# Patient Record
Sex: Female | Born: 1995 | Race: Black or African American | Hispanic: No | Marital: Single | Smoking: Former smoker
Health system: Southern US, Community
[De-identification: ages and names within clinical notes are randomized; demographics above are authoritative.]

## PROBLEM LIST (undated history)

## (undated) DIAGNOSIS — D649 Anemia, unspecified: Secondary | ICD-10-CM

## (undated) DIAGNOSIS — K219 Gastro-esophageal reflux disease without esophagitis: Secondary | ICD-10-CM

## (undated) DIAGNOSIS — F419 Anxiety disorder, unspecified: Secondary | ICD-10-CM

## (undated) DIAGNOSIS — I10 Essential (primary) hypertension: Secondary | ICD-10-CM

## (undated) DIAGNOSIS — Z8719 Personal history of other diseases of the digestive system: Secondary | ICD-10-CM

## (undated) HISTORY — PX: WISDOM TOOTH EXTRACTION: SHX21

## (undated) HISTORY — DX: Essential (primary) hypertension: I10

---
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
## 2017-01-02 ENCOUNTER — Ambulatory Visit: Payer: BLUE CROSS/BLUE SHIELD | Admitting: Physical Therapy

## 2017-01-02 DIAGNOSIS — R2689 Other abnormalities of gait and mobility: Secondary | ICD-10-CM

## 2017-01-02 DIAGNOSIS — M25562 Pain in left knee: Secondary | ICD-10-CM | POA: Diagnosis not present

## 2017-01-02 DIAGNOSIS — M6281 Muscle weakness (generalized): Secondary | ICD-10-CM

## 2017-01-02 DIAGNOSIS — G8929 Other chronic pain: Secondary | ICD-10-CM

## 2017-01-02 DIAGNOSIS — M25561 Pain in right knee: Secondary | ICD-10-CM

## 2017-01-02 NOTE — Therapy (Signed)
[facility] [address] [place], [place], [address] Phone: [phone]   Fax:  [phone]  Physical Therapy Treatment  Patient Details  Name: Abbye Lao MRN: [id number] Date of Birth: 02/09/1996 Referring Provider: Rodolph Bong, MD  Encounter Date: 01/02/2017      PT End of Session - 01/02/17 1211    Visit Number 3   Number of Visits 17   Date for PT Re-Evaluation 02/07/17   Authorization Type Medicaid: 16 visits through 02/11/17   Authorization - Visit Number 2   Authorization - Number of Visits 16   PT Start Time 1156  arrived late   PT Stop Time 1234   PT Time Calculation (min) 38 min   Activity Tolerance Patient tolerated treatment well   Behavior During Therapy [facility] for tasks assessed/performed      No past medical history on file.  No past surgical history on file.  There were no vitals filed for this visit.      Subjective Assessment - 01/02/17 1200    Subjective Some mornings I can't put any weight on my knee and that has been happen more consistently.  I can start putting weight on it in the afternoons.  I am doing the exercises but doesn't seem to help.   Limitations Walking;Standing   How long can you stand comfortably? weight shifing to the Rt at work.  10/10 pain after 3-4 hours at work.    How long can you walk comfortably? 10 minutes   Diagnostic tests x-ray: pt reports that the Lt knee is "growing out of alignment"   Patient Stated Goals reduce pain, stand for work, walk longer   Currently in Pain? Yes   Pain Score 6    Pain Location Knee   Pain Orientation Left;Right   Pain Descriptors / Indicators Aching;Tightness;Shooting;Throbbing   Pain Type Chronic pain   Pain Onset More than a month ago   Pain Frequency Constant   Aggravating Factors  standing and walking   Pain Relieving Factors no pressure, anti-inflammatory helps swelling   Multiple Pain Sites No                          [facility] Adult PT Treatment/Exercise - 01/02/17 0001      Therapeutic Activites    Therapeutic Activities ADL's   ADL's walking and standing with focus on slight knee bend     Knee/Hip Exercises: Stretches   Active Hamstring Stretch Both;5 reps;10 seconds   Soleus Stretch Both;5 reps;10 seconds     Knee/Hip Exercises: Aerobic   Nustep Level 2 x 6 minutes     Knee/Hip Exercises: Standing   Knee Flexion Strengthening;Both;10 reps     Knee/Hip Exercises: Seated   Long Arc Quad Strengthening;Both;2 sets;10 reps  2 lb     Knee/Hip Exercises: Supine   Short Arc Quad Sets Strengthening;Both;10 reps   Short Arc Quad Sets Limitations 2 lb with 5 sec holds   Straight Leg Raises Strengthening;Both;10 reps   Straight Leg Raises Limitations 5 sec holds                PT Education - 01/02/17 1235    Education provided Yes   Education Details soleus stretch   Person(s) Educated Patient   Methods Demonstration;Handout;Explanation;Verbal cues   Comprehension Verbalized understanding;Returned demonstration          PT Short Term Goals - 01/02/17 1218  PT SHORT TERM GOAL #1   Title be independent in initial HEP   Baseline reports she has been performing at home   Time 4   Period Weeks   Status On-going     PT SHORT TERM GOAL #2   Title report 25% reduction in Lt knee pain at the end of a work shift   Time 4   Period Weeks   Status On-going     PT SHORT TERM GOAL #3   Title improve strength and reduce pain to walk for 15 minutes without limitation   Time 4   Period Weeks   Status On-going     PT SHORT TERM GOAL #4   Title demonstrate 4-/5 Lt quad strength without lag to improve stability and endurance   Time 4   Period Weeks   Status On-going           PT Long Term Goals - 12/13/16 1858      PT LONG TERM GOAL #1   Title be independent in advanced HEP   Baseline no HEP in place   Time 8   Period Weeks   Status  New     PT LONG TERM GOAL #2   Title reduce FOTO to < or = to 42% limitation   Baseline 57%   Time 8   Period Weeks   Status New     PT LONG TERM GOAL #3   Title report < or = to 5/10 Lt and Rt knee pain at the end of a work shift     Baseline 7-10/10   Time 8   Period Weeks   Status New     PT LONG TERM GOAL #4   Title demonstrate Lt quad strength to > or = to 4/5 to improve stability     Baseline 3+/5 with lag   Time 8   Period Weeks   Status New     PT LONG TERM GOAL #5   Title reduce pain to walk for > or = to 30 minutes to improve community independence     Baseline 10 minutes with 7-10/10 pain   Time 8   Period Weeks   Status New               Plan - 01/02/17 1214    Clinical Impression Statement Patient able to add resistance to exercises today.  She is challenged with exercises demonstrating fatigue and has difficulty with straight leg raise only able to lift 2 inches from mat.  Needed education and cues to not lock out knees when standing and walking.  given soleus stretch for increased ankle DF ROM.  Continues to need skilled PT for improved LE strength and functional standing and walking activities.   PT Treatment/Interventions ADLs/Self Care Home Management;Cryotherapy;Electrical Stimulation;Iontophoresis 4mg /ml Dexamethasone;Functional mobility training;Stair training;Gait training;Ultrasound;Moist Heat;Therapeutic activities;Therapeutic exercise;Balance training;Neuromuscular re-education;Patient/family education;Passive range of motion;Manual techniques;Taping;Vasopneumatic Device   PT Next Visit Plan progress LE strength and flexibility as tolerated   Consulted and Agree with Plan of Care Patient      Patient will benefit from skilled therapeutic intervention in order to improve the following deficits and impairments:  Abnormal gait, Difficulty walking, Pain, Decreased endurance, Decreased activity tolerance, Impaired flexibility, Decreased  strength  Visit Diagnosis: Chronic pain of left knee  Chronic pain of right knee  Muscle weakness (generalized)  Other abnormalities of gait and mobility     Problem List There are no active problems to display for this patient.  Vincente Poli, PT 01/02/2017, 12:52 PM  [facility] [address] [place], [place], [address] Phone: [phone]   Fax:  [phone]  Name: Adeley Hodgin MRN: [id number] Date of Birth: February 13, 1996

## 2017-01-02 NOTE — Patient Instructions (Signed)
Achilles / Soleus, Standing    Stand, right foot behind, heel on floor and turned slightly out. Lower hips and bend knees. Hold _10__ seconds. Repeat __5_ times per session. Do __2_ sessions per day on both sides  Copyright  VHI. All rights reserved.

## 2017-01-04 ENCOUNTER — Encounter: Payer: Medicaid Other | Admitting: Physical Therapy

## 2017-01-10 ENCOUNTER — Ambulatory Visit: Payer: BLUE CROSS/BLUE SHIELD | Attending: Sports Medicine | Admitting: Physical Therapy

## 2017-01-10 DIAGNOSIS — R2689 Other abnormalities of gait and mobility: Secondary | ICD-10-CM | POA: Diagnosis present

## 2017-01-10 DIAGNOSIS — G8929 Other chronic pain: Secondary | ICD-10-CM | POA: Diagnosis present

## 2017-01-10 DIAGNOSIS — M25562 Pain in left knee: Secondary | ICD-10-CM | POA: Diagnosis not present

## 2017-01-10 DIAGNOSIS — M6281 Muscle weakness (generalized): Secondary | ICD-10-CM | POA: Diagnosis present

## 2017-01-10 DIAGNOSIS — M25561 Pain in right knee: Secondary | ICD-10-CM | POA: Insufficient documentation

## 2017-01-10 NOTE — Therapy (Signed)
[facility] [address], [address] [place], [place], [address] Phone: [phone]   Fax:  [phone]  Physical Therapy Treatment  Patient Details  Name: Brooke Horn MRN: [id number] Date of Birth: 1995/11/14 Referring Provider: Rodolph Bong, MD  Encounter Date: 01/10/2017      PT End of Session - 01/10/17 1201    Visit Number 4   Number of Visits 17   Date for PT Re-Evaluation 02/07/17   Authorization Type Medicaid: 16 visits through 02/11/17   Authorization - Visit Number 3   Authorization - Number of Visits 16   PT Start Time 1153   PT Stop Time 1232   PT Time Calculation (min) 39 min   Activity Tolerance Patient tolerated treatment well;No increased pain   Behavior During Therapy [facility] for tasks assessed/performed      No past medical history on file.  No past surgical history on file.  There were no vitals filed for this visit.      Subjective Assessment - 01/10/17 1157    Subjective Pt reports that she woke up with a bad headache today. She says she is still completing her exercises at home, but these don't seem to be making much of a difference.    Limitations Walking;Standing   How long can you stand comfortably? weight shifing to the Rt at work.  10/10 pain after 3-4 hours at work.    How long can you walk comfortably? 10 minutes   Diagnostic tests x-ray: pt reports that the Lt knee is "growing out of alignment"   Patient Stated Goals reduce pain, stand for work, walk longer   Currently in Pain? No/denies  none sitting here right now    Pain Onset More than a month ago                         [facility] Adult PT Treatment/Exercise - 01/10/17 0001      Knee/Hip Exercises: Stretches   Lobbyist Both;3 reps;30 seconds   Quad Stretch Limitations prone      Knee/Hip Exercises: Supine   Other Supine Knee/Hip Exercises supine BUE pressdown with hip flexion press on physioball x15 reps each       Knee/Hip Exercises: Sidelying   Hip ABduction Both;2 sets;5 reps   Hip ABduction Limitations Hip ER hold during flexion/extension      Manual Therapy   Manual Therapy Soft tissue mobilization;Myofascial release   Soft tissue mobilization STM Lt quadriceps    Myofascial Release TrP release Lt recuts femoris                 PT Education - 01/10/17 1203    Education provided Yes   Education Details technique with therex; educated pt on benefits of dry needling    Person(s) Educated Patient   Methods Explanation;Handout   Comprehension Verbalized understanding          PT Short Term Goals - 01/02/17 1218      PT SHORT TERM GOAL #1   Title be independent in initial HEP   Baseline reports she has been performing at home   Time 4   Period Weeks   Status On-going     PT SHORT TERM GOAL #2   Title report 25% reduction in Lt knee pain at the end of a work shift   Time 4   Period Weeks   Status On-going     PT SHORT TERM GOAL #3  Title improve strength and reduce pain to walk for 15 minutes without limitation   Time 4   Period Weeks   Status On-going     PT SHORT TERM GOAL #4   Title demonstrate 4-/5 Lt quad strength without lag to improve stability and endurance   Time 4   Period Weeks   Status On-going           PT Long Term Goals - 12/13/16 1858      PT LONG TERM GOAL #1   Title be independent in advanced HEP   Baseline no HEP in place   Time 8   Period Weeks   Status New     PT LONG TERM GOAL #2   Title reduce FOTO to < or = to 42% limitation   Baseline 57%   Time 8   Period Weeks   Status New     PT LONG TERM GOAL #3   Title report < or = to 5/10 Lt and Rt knee pain at the end of a work shift     Baseline 7-10/10   Time 8   Period Weeks   Status New     PT LONG TERM GOAL #4   Title demonstrate Lt quad strength to > or = to 4/5 to improve stability     Baseline 3+/5 with lag   Time 8   Period Weeks   Status New     PT LONG TERM  GOAL #5   Title reduce pain to walk for > or = to 30 minutes to improve community independence     Baseline 10 minutes with 7-10/10 pain   Time 8   Period Weeks   Status New               Plan - 01/10/17 1229    Clinical Impression Statement Pt continues to report minimal improvements in her knee pain with HEP adherence. Session focused on exercise to increase posterior chain activation as well as completing manual techniques to address muscle knotting and trigger points specifically throughout the Lt quadriceps. Pt able to complete all exercises without report of increase in pain, however quadriceps stretches at the end of the session did reportedly aggravate her Lt knee. Therapist encouraged pt to continue with her HEP as instructed and will updated as needed.    PT Treatment/Interventions ADLs/Self Care Home Management;Cryotherapy;Electrical Stimulation;Iontophoresis 4mg /ml Dexamethasone;Functional mobility training;Stair training;Gait training;Ultrasound;Moist Heat;Therapeutic activities;Therapeutic exercise;Balance training;Neuromuscular re-education;Patient/family education;Passive range of motion;Manual techniques;Taping;Vasopneumatic Device   PT Next Visit Plan progress LE strength and flexibility as tolerated; possible dry needling to quadriceps    Consulted and Agree with Plan of Care Patient      Patient will benefit from skilled therapeutic intervention in order to improve the following deficits and impairments:  Abnormal gait, Difficulty walking, Pain, Decreased endurance, Decreased activity tolerance, Impaired flexibility, Decreased strength  Visit Diagnosis: Chronic pain of left knee  Chronic pain of right knee  Muscle weakness (generalized)  Other abnormalities of gait and mobility     Problem List There are no active problems to display for this patient.   12:36 PM,01/10/17 Marylyn IshiharaSara Kiser PT, DPT [facility]   [phone]  [facility] [address] [place], [place], [address] Phone: [phone]   Fax:  [phone]  Name: Brooke Horn MRN: [id number] Date of Birth: 07/20/1995

## 2017-01-10 NOTE — Patient Instructions (Signed)

## 2017-01-11 ENCOUNTER — Ambulatory Visit: Payer: BLUE CROSS/BLUE SHIELD | Admitting: Physical Therapy

## 2017-01-11 DIAGNOSIS — M6281 Muscle weakness (generalized): Secondary | ICD-10-CM

## 2017-01-11 DIAGNOSIS — G8929 Other chronic pain: Secondary | ICD-10-CM

## 2017-01-11 DIAGNOSIS — M25561 Pain in right knee: Secondary | ICD-10-CM

## 2017-01-11 DIAGNOSIS — R2689 Other abnormalities of gait and mobility: Secondary | ICD-10-CM

## 2017-01-11 DIAGNOSIS — M25562 Pain in left knee: Principal | ICD-10-CM

## 2017-01-11 NOTE — Therapy (Addendum)
[facility] [address], [place], [place], [address] Phone: [phone]   Fax:  [phone]  Physical Therapy Treatment/Discharge  Patient Details  Name: Brooke Horn MRN: [id number] Date of Birth: [birth year] Referring Provider: Vickki Hearing, MD  Encounter Date: 01/11/2017      PT End of Session - 01/11/17 1243    Visit Number 5   Number of Visits 17   Date for PT Re-Evaluation 02/07/17   Authorization Type Medicaid: 16 visits through 02/11/17   Authorization - Visit Number 4   Authorization - Number of Visits 16   PT Start Time 1324   PT Stop Time 4010   PT Time Calculation (min) 38 min   Activity Tolerance Patient tolerated treatment well;No increased pain   Behavior During Therapy [facility] for tasks assessed/performed      No past medical history on file.  No past surgical history on file.  There were no vitals filed for this visit.      Subjective Assessment - 01/11/17 1242    Subjective Pt reports that she was sore following her last session. She has no complaints at this time. She is interested in doing the dry needling, but not this session.    Limitations Walking;Standing   How long can you stand comfortably? weight shifing to the Rt at work.  10/10 pain after 3-4 hours at work.    How long can you walk comfortably? 10 minutes   Diagnostic tests x-ray: pt reports that the Lt knee is "growing out of alignment"   Patient Stated Goals reduce pain, stand for work, walk longer   Currently in Pain? No/denies   Pain Onset More than a month ago                         [facility] Adult PT Treatment/Exercise - 01/11/17 0001      Exercises   Exercises Other Exercises   Other Exercises  quadruped firehydrants 2x10 reps each LE; Prone foam rolling quadriceps 2x2 min bouts      Knee/Hip Exercises: Machines for Strengthening   Cybex Leg Press seat 8, BLE with green TB around knees first set with 50#  x20 reps, 2nd set increased to 90# x10 reps      Knee/Hip Exercises: Supine   Straight Leg Raises Both;10 reps;1 set   Straight Leg Raises Limitations slight hip ER and BUE pressdown      Knee/Hip Exercises: Sidelying   Clams 2x10 reps each with green TB and abdominal activation      Ankle Exercises: Stretches   Gastroc Stretch 2 reps;30 seconds  slantboard with ball between knees                 PT Education - 01/11/17 1319    Education provided Yes   Education Details technique with therex; addition to HEP    Person(s) Educated Patient   Methods Handout;Verbal cues;Tactile cues;Explanation   Comprehension Verbalized understanding;Returned demonstration          PT Short Term Goals - 01/02/17 1218      PT SHORT TERM GOAL #1   Title be independent in initial HEP   Baseline reports she has been performing at home   Time 4   Period Weeks   Status On-going     PT SHORT TERM GOAL #2   Title report 25% reduction in Lt knee pain at the end of a work shift   Time  4   Period Weeks   Status On-going     PT SHORT TERM GOAL #3   Title improve strength and reduce pain to walk for 15 minutes without limitation   Time 4   Period Weeks   Status On-going     PT SHORT TERM GOAL #4   Title demonstrate 4-/5 Lt quad strength without lag to improve stability and endurance   Time 4   Period Weeks   Status On-going           PT Long Term Goals - 12/13/16 1858      PT LONG TERM GOAL #1   Title be independent in advanced HEP   Baseline no HEP in place   Time 8   Period Weeks   Status New     PT LONG TERM GOAL #2   Title reduce FOTO to < or = to 42% limitation   Baseline 57%   Time 8   Period Weeks   Status New     PT LONG TERM GOAL #3   Title report < or = to 5/10 Lt and Rt knee pain at the end of a work shift     Baseline 7-10/10   Time 8   Period Weeks   Status New     PT LONG TERM GOAL #4   Title demonstrate Lt quad strength to > or = to 4/5 to  improve stability     Baseline 3+/5 with lag   Time 8   Period Weeks   Status New     PT LONG TERM GOAL #5   Title reduce pain to walk for > or = to 30 minutes to improve community independence     Baseline 10 minutes with 7-10/10 pain   Time 8   Period Weeks   Status New               Plan - 01/11/17 1319    Clinical Impression Statement Pt reporting no pain upon arrival, however she does seem to continue having issues with being on her feet for long periods of time. Continued this session with therex to promote proximal hip and trunk strength. Pt did demonstrate muscle shaking and fatigue with new exercises completed, and she required moderate verbal/tactile cues to improve technique. Ended with foam rolling and stretching, pt reporting no increase in pain by the end of today's session.   PT Treatment/Interventions ADLs/Self Care Home Management;Cryotherapy;Electrical Stimulation;Iontophoresis 91m/ml Dexamethasone;Functional mobility training;Stair training;Gait training;Ultrasound;Moist Heat;Therapeutic activities;Therapeutic exercise;Balance training;Neuromuscular re-education;Patient/family education;Passive range of motion;Manual techniques;Taping;Vasopneumatic Device   PT Next Visit Plan progress proximal LE strength and flexibility as tolerated; possible dry needling to quadriceps    PT Home Exercise Plan added sidelying clamshells    Consulted and Agree with Plan of Care Patient      Patient will benefit from skilled therapeutic intervention in order to improve the following deficits and impairments:  Abnormal gait, Difficulty walking, Pain, Decreased endurance, Decreased activity tolerance, Impaired flexibility, Decreased strength  Visit Diagnosis: Chronic pain of left knee  Chronic pain of right knee  Other abnormalities of gait and mobility  Muscle weakness (generalized)     Problem List There are no active problems to display for this patient.   1:24  PM,01/11/17 [place] DPT [facility] [address] [place] [address]Phone: [phone]  Fax:  [phone] Name: Brooke Glassing  Horn MRN: [id number] Date of Birth: March 31, 1996     *Addendum to discharge pt from PT and resolve episode of care  PHYSICAL THERAPY DISCHARGE SUMMARY  Visits from Start of Care: 5  Current functional level related to goals / functional outcomes: See above for more details     Remaining deficits: See above for more details    Education / Equipment: See above for more details  Plan: Patient agrees to discharge.  Patient goals were not met. Patient is being discharged due to the patient's request.  ?????     Pt called on 01/30/17 requesting to be discharged from PT due to going out of town for a while and being unable to come to her appointments.  12:15 PM,01/30/17 [place], [place] [facility] [place]

## 2017-01-12 ENCOUNTER — Encounter: Payer: Medicaid Other | Admitting: Physical Therapy

## 2017-01-23 ENCOUNTER — Telehealth: Payer: Self-pay

## 2017-01-23 ENCOUNTER — Ambulatory Visit: Payer: BLUE CROSS/BLUE SHIELD

## 2017-01-23 NOTE — Telephone Encounter (Signed)
PT called pt due to missed appointment today.  She forgot about appointment.  She also canceled appointment for 01/25/17 due to conflict with her MD appointment.

## 2017-01-30 ENCOUNTER — Encounter: Payer: Medicaid Other | Admitting: Physical Therapy

## 2017-02-01 ENCOUNTER — Encounter: Payer: Medicaid Other | Admitting: Physical Therapy

## 2018-05-08 DIAGNOSIS — J45909 Unspecified asthma, uncomplicated: Secondary | ICD-10-CM

## 2018-05-08 HISTORY — DX: Unspecified asthma, uncomplicated: J45.909

## 2018-05-08 NOTE — L&D Delivery Note (Signed)
Delivery Note   Patient Name: Brooke Horn DOB: [birth year] MRN: [id number]  Date of admission: 04/26/2019 Delivering MD: Noralyn Pick  Date of delivery: 04/26/19 Type of delivery: SVD  Newborn Data: Live born female  Birth Weight:   APGAR: ,   Newborn Delivery   Birth date/time: 04/26/2019 12:14:00 Delivery type:      Brooke Horn, 23 y.o., @ [redacted]w[redacted]d,  G1P0, who was admitted for IOL for postdates. I was called to the room when she progressed +2 station in the second stage of labor with BBOW, verbal consent for AROM, clear tolerated well.  She pushed for 15/min.  She delivered a viable infant, cephalic and restituted to the ROT position over an intact perineum.  A nuchal cord   was not identified. The baby was placed on maternal abdomen while initial step of NRP were perfmored (Dry, Stimulated, and warmed). Hat placed on baby for thermoregulation. Delayed cord clamping was performed for 2 minutes.  Cord double clamped and cut.  Cord cut by grandmother. Apgar scores were 9 and 9. Prophylactic Pitocin was started in the third stage of labor for active management. The placenta delivered spontaneously, shultz, with a 2 vessel cord and was sent to LD.  Inspection revealed 1st degree and labial. An examination of the vaginal vault and cervix was free from lacerations. The uterus was firm, bleeding stable.  The repair was done under lidocaine.   Placenta and umbilical artery blood gas were not sent.  There were no complications during the procedure.  Mom and baby skin to skin following delivery. Left in stable condition.  Maternal Info: Anesthesia: All natural Episiotomy: no Lacerations:  1st degree with left perilabial Suture Repair: 3.0 vicryl CT-1 and 4.0 vicryl SH Est. Blood Loss (mL):  493mls  Newborn Info:  Baby Sex: female Babies Name: Brooke Horn APGAR (1 MIN):   APGAR (5 MINS):   APGAR (10 MINS):     Mom to postpartum.  Baby to Couplet care / Skin to Skin.  DR Nelda Marseille updated on birth     [place], [place], NP-C 04/26/19 1:01 PM

## 2018-08-19 ENCOUNTER — Other Ambulatory Visit: Payer: Self-pay

## 2018-08-19 ENCOUNTER — Encounter (HOSPITAL_COMMUNITY): Payer: Self-pay | Admitting: Emergency Medicine

## 2018-08-19 ENCOUNTER — Emergency Department (HOSPITAL_COMMUNITY)
Admission: EM | Admit: 2018-08-19 | Discharge: 2018-08-19 | Disposition: A | Discharge status: Home / Self Care | Payer: BLUE CROSS/BLUE SHIELD | Attending: Emergency Medicine | Admitting: Emergency Medicine

## 2018-08-19 DIAGNOSIS — Z3A01 Less than 8 weeks gestation of pregnancy: Secondary | ICD-10-CM

## 2018-08-19 DIAGNOSIS — Z3201 Encounter for pregnancy test, result positive: Secondary | ICD-10-CM | POA: Diagnosis present

## 2018-08-19 DIAGNOSIS — F17210 Nicotine dependence, cigarettes, uncomplicated: Secondary | ICD-10-CM | POA: Diagnosis not present

## 2018-08-19 LAB — POC URINE PREG, ED: Preg Test, Ur: POSITIVE — AB

## 2018-08-19 MED ORDER — PRENATAL COMPLETE 14-0.4 MG PO TABS: 1.0000 | ORAL_TABLET | Freq: Every day | ORAL | 0 refills | Status: DC

## 2018-08-19 NOTE — Discharge Instructions (Addendum)
Please read attached information. If you experience any new or worsening signs or symptoms please return to the emergency room for evaluation. Please follow-up with your primary care provider or specialist as discussed. Please use medication prescribed only as directed and discontinue taking if you have any concerning signs or symptoms.   °

## 2018-08-19 NOTE — ED Provider Notes (Signed)
[facility] EMERGENCY DEPARTMENT Provider Note   CSN: [id number] Arrival date & time: 08/19/18  0848    History   Chief Complaint Chief Complaint  Patient presents with  . Possible Pregnancy    HPI Brooke Horn is a 23 y.o. female.     HPI   G89, P63 23 year old female presents today with complaints of pregnancy.  Patient notes her last menstrual cycle was on March 7.  She notes intermittent minimal crampy sensation in her pelvis denies any severe pain, denies any vaginal leading or discharge.  Patient notes breast tenderness.  Notes she had 4+ pregnancy test at home and is here to verify pregnancy status.  She does not have an OB/GYN presently.   History reviewed. No pertinent past medical history.  There are no active problems to display for this patient.   History reviewed. No pertinent surgical history.   OB History   No obstetric history on file.      Home Medications    Prior to Admission medications   Medication Sig Start Date End Date Taking? Authorizing Provider  naproxen (NAPROSYN) 250 MG tablet Take by mouth 2 (two) times daily with a meal.    [provider]  Prenatal Vit-Fe Fumarate-FA (PRENATAL COMPLETE) 14-0.4 MG TABS Take 1 tablet by mouth daily. 08/19/18   Eyvonne Mechanic, PA-C    Family History No family history on file.  Social History Social History   Tobacco Use  . Smoking status: Current Every Day Smoker  . Smokeless tobacco: Current User  Substance Use Topics  . Alcohol use: Not Currently  . Drug use: Not Currently     Allergies   Patient has no known allergies.   Review of Systems Review of Systems  All other systems reviewed and are negative.    Physical Exam Updated Vital Signs BP 136/68 (BP Location: Right Arm)   Pulse 81   Temp 99.4 F (37.4 C) (Oral)   Resp 16   LMP 07/16/2018   SpO2 100%   Physical Exam Vitals signs and nursing note reviewed.  Constitutional:      Appearance: She  is well-developed.  HENT:     Head: Normocephalic and atraumatic.  Eyes:     General: No scleral icterus.       Right eye: No discharge.        Left eye: No discharge.     Conjunctiva/sclera: Conjunctivae normal.     Pupils: Pupils are equal, round, and reactive to light.  Neck:     Musculoskeletal: Normal range of motion.     Vascular: No JVD.     Trachea: No tracheal deviation.  Pulmonary:     Effort: Pulmonary effort is normal.     Breath sounds: No stridor.  Abdominal:     Comments: Abdomen soft nttp   Neurological:     Mental Status: She is alert and oriented to person, place, and time.     Coordination: Coordination normal.  Psychiatric:        Behavior: Behavior normal.        Thought Content: Thought content normal.        Judgment: Judgment normal.      ED Treatments / Results  Labs (all labs ordered are listed, but only abnormal results are displayed) Labs Reviewed  POC URINE PREG, ED - Abnormal; Notable for the following components:      Result Value   Preg Test, Ur POSITIVE (*)    All other  components within normal limits    EKG None  Radiology No results found.  Procedures Procedures (including critical care time)  Medications Ordered in ED Medications - No data to display   Initial Impression / Assessment and Plan / ED Course  I have reviewed the triage vital signs and the nursing notes.  Pertinent labs & imaging results that were available during my care of the patient were reviewed by me and considered in my medical decision making (see chart for details).        Labs:   Imaging:  Consults:  Therapeutics:  Discharge Meds:   Assessment/Plan: 23 year old female presents today with trimester pregnancy.  She has no complicating features here.  She is stable for outpatient follow-up.  Return precautions given.  She verbalized understanding and agreement to this plan.      Final Clinical Impressions(s) / ED Diagnoses   Final  diagnoses:  Less than [redacted] weeks gestation of pregnancy    ED Discharge Orders         Ordered    Prenatal Vit-Fe Fumarate-FA (PRENATAL COMPLETE) 14-0.4 MG TABS  Daily     08/19/18 0938           Eyvonne MechanicHedges, Viann Nielson, PA-C 08/19/18 1131    Loren RacerYelverton, David, MD 08/20/18 [phone]

## 2018-08-19 NOTE — ED Triage Notes (Signed)
Pt. Stated,Ive took 4 pregnancy test and positive and just want to make sure I am.

## 2018-08-20 ENCOUNTER — Telehealth: Payer: Self-pay | Admitting: Obstetrics & Gynecology

## 2018-08-20 NOTE — Telephone Encounter (Signed)
The patient called to schedule an appointment as she recently found she is pregnant. She currently has insurance with her dad however she is unsure if it will continue to be active and does not want to use it. Stated she will get her own insurance through [place]. Informed of our new address.

## 2018-09-19 ENCOUNTER — Encounter (HOSPITAL_COMMUNITY): Payer: Self-pay

## 2018-09-19 LAB — OB RESULTS CONSOLE RPR: RPR: NONREACTIVE

## 2018-09-19 LAB — OB RESULTS CONSOLE RUBELLA ANTIBODY, IGM: Rubella: IMMUNE

## 2018-09-19 LAB — OB RESULTS CONSOLE ANTIBODY SCREEN: Antibody Screen: NEGATIVE

## 2018-09-19 LAB — OB RESULTS CONSOLE GBS: GBS: NEGATIVE

## 2018-09-19 LAB — OB RESULTS CONSOLE ABO/RH: RH Type: POSITIVE

## 2018-09-19 LAB — OB RESULTS CONSOLE HIV ANTIBODY (ROUTINE TESTING): HIV: NONREACTIVE

## 2018-09-19 LAB — OB RESULTS CONSOLE HEPATITIS B SURFACE ANTIGEN: Hepatitis B Surface Ag: NEGATIVE

## 2018-09-25 LAB — OB RESULTS CONSOLE GC/CHLAMYDIA: Chlamydia: POSITIVE

## 2018-10-18 ENCOUNTER — Other Ambulatory Visit: Payer: Self-pay

## 2018-10-28 ENCOUNTER — Encounter (HOSPITAL_COMMUNITY): Payer: Self-pay | Admitting: *Deleted

## 2018-10-30 ENCOUNTER — Other Ambulatory Visit: Payer: Self-pay

## 2018-10-30 ENCOUNTER — Ambulatory Visit (HOSPITAL_COMMUNITY): Payer: Self-pay | Admitting: Obstetrics and Gynecology

## 2018-10-30 ENCOUNTER — Ambulatory Visit (HOSPITAL_COMMUNITY): Payer: BC Managed Care – PPO | Attending: Obstetrics and Gynecology

## 2018-12-06 ENCOUNTER — Other Ambulatory Visit (HOSPITAL_COMMUNITY): Payer: Self-pay | Admitting: Obstetrics and Gynecology

## 2018-12-09 ENCOUNTER — Ambulatory Visit (HOSPITAL_COMMUNITY): Payer: Medicaid Other | Admitting: *Deleted

## 2018-12-09 ENCOUNTER — Ambulatory Visit (HOSPITAL_COMMUNITY)
Admission: RE | Admit: 2018-12-09 | Discharge: 2018-12-09 | Disposition: A | Discharge status: Home / Self Care | Payer: Medicaid Other | Source: Ambulatory Visit | Attending: Obstetrics and Gynecology | Admitting: Obstetrics and Gynecology

## 2018-12-09 ENCOUNTER — Encounter (HOSPITAL_COMMUNITY): Payer: Self-pay | Admitting: *Deleted

## 2018-12-09 ENCOUNTER — Other Ambulatory Visit: Payer: Self-pay

## 2018-12-09 ENCOUNTER — Other Ambulatory Visit (HOSPITAL_COMMUNITY): Payer: Self-pay | Admitting: *Deleted

## 2018-12-09 VITALS — BP 113/62 | HR 65 | Temp 98.6°F

## 2018-12-09 DIAGNOSIS — O359XX0 Maternal care for (suspected) fetal abnormality and damage, unspecified, not applicable or unspecified: Secondary | ICD-10-CM | POA: Diagnosis not present

## 2018-12-09 DIAGNOSIS — O26842 Uterine size-date discrepancy, second trimester: Secondary | ICD-10-CM | POA: Diagnosis not present

## 2018-12-09 DIAGNOSIS — Z3A21 21 weeks gestation of pregnancy: Secondary | ICD-10-CM

## 2018-12-09 DIAGNOSIS — Z363 Encounter for antenatal screening for malformations: Secondary | ICD-10-CM

## 2018-12-09 DIAGNOSIS — Z3689 Encounter for other specified antenatal screening: Secondary | ICD-10-CM | POA: Diagnosis present

## 2018-12-09 DIAGNOSIS — O99212 Obesity complicating pregnancy, second trimester: Secondary | ICD-10-CM | POA: Diagnosis not present

## 2018-12-09 DIAGNOSIS — Z362 Encounter for other antenatal screening follow-up: Secondary | ICD-10-CM

## 2019-01-07 ENCOUNTER — Ambulatory Visit (HOSPITAL_COMMUNITY)
Admission: RE | Admit: 2019-01-07 | Discharge: 2019-01-07 | Disposition: A | Discharge status: Home / Self Care | Payer: Medicaid Other | Source: Ambulatory Visit | Attending: Obstetrics and Gynecology | Admitting: Obstetrics and Gynecology

## 2019-01-07 ENCOUNTER — Encounter (HOSPITAL_COMMUNITY): Payer: Self-pay | Admitting: *Deleted

## 2019-01-07 ENCOUNTER — Other Ambulatory Visit: Payer: Self-pay

## 2019-01-07 ENCOUNTER — Ambulatory Visit (HOSPITAL_COMMUNITY): Payer: Medicaid Other | Admitting: *Deleted

## 2019-01-07 VITALS — BP 117/65 | HR 83 | Temp 98.7°F

## 2019-01-07 DIAGNOSIS — O359XX0 Maternal care for (suspected) fetal abnormality and damage, unspecified, not applicable or unspecified: Secondary | ICD-10-CM

## 2019-01-07 DIAGNOSIS — O99212 Obesity complicating pregnancy, second trimester: Secondary | ICD-10-CM | POA: Diagnosis not present

## 2019-01-07 DIAGNOSIS — Z362 Encounter for other antenatal screening follow-up: Secondary | ICD-10-CM

## 2019-01-07 DIAGNOSIS — I10 Essential (primary) hypertension: Secondary | ICD-10-CM | POA: Insufficient documentation

## 2019-01-07 DIAGNOSIS — O26842 Uterine size-date discrepancy, second trimester: Secondary | ICD-10-CM | POA: Diagnosis not present

## 2019-01-07 DIAGNOSIS — Z3A25 25 weeks gestation of pregnancy: Secondary | ICD-10-CM

## 2019-01-14 ENCOUNTER — Encounter (HOSPITAL_COMMUNITY): Payer: Self-pay

## 2019-01-14 ENCOUNTER — Other Ambulatory Visit (HOSPITAL_COMMUNITY): Payer: Self-pay | Admitting: Obstetrics and Gynecology

## 2019-02-05 LAB — OB RESULTS CONSOLE GC/CHLAMYDIA: Chlamydia: POSITIVE

## 2019-04-21 ENCOUNTER — Encounter (HOSPITAL_COMMUNITY): Payer: Self-pay | Admitting: *Deleted

## 2019-04-21 ENCOUNTER — Telehealth (HOSPITAL_COMMUNITY): Payer: Self-pay | Admitting: *Deleted

## 2019-04-21 NOTE — Telephone Encounter (Signed)
Preadmission screen  

## 2019-04-23 ENCOUNTER — Other Ambulatory Visit: Payer: Self-pay | Admitting: Obstetrics & Gynecology

## 2019-04-24 ENCOUNTER — Other Ambulatory Visit (HOSPITAL_COMMUNITY)
Admission: RE | Admit: 2019-04-24 | Discharge: 2019-04-24 | Disposition: A | Discharge status: Home / Self Care | Payer: Medicaid Other | Source: Ambulatory Visit | Attending: Obstetrics & Gynecology | Admitting: Obstetrics & Gynecology

## 2019-04-24 DIAGNOSIS — Z20828 Contact with and (suspected) exposure to other viral communicable diseases: Secondary | ICD-10-CM | POA: Diagnosis not present

## 2019-04-24 DIAGNOSIS — Z01812 Encounter for preprocedural laboratory examination: Secondary | ICD-10-CM | POA: Insufficient documentation

## 2019-04-24 LAB — SARS CORONAVIRUS 2 (TAT 6-24 HRS): SARS Coronavirus 2: NEGATIVE

## 2019-04-25 NOTE — H&P (Signed)
  History of Present Illness: Pt is a 23 year old G1P0 at 41.0 weeks who presents for a scheduled IOL for post dates. Denies contractions, vb, or lof. Reports adequate fetal movement.     Review of Systems: Negative x 14 systems reviewed except as noted in the HPI.    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  Heart: regular rate & rhythm.  No murmurs/rubs/gallops Lungs: clear to auscultation bilaterally Abdomen: soft, gravid, non-tender Pelvic:External: Normal external female genitalia  Cervix: 2/50/-2 IBOW  Bedside US confirms vertex presentation  Extremities: non-tender, symmetric, mild edema bilaterally.  DTRs: WNL  Neurologic: Alert & oriented x 3.   FHR tracing: Cat I Contractions: Absent         Assessment: Carlia Horn is a 23 y.o. G1P0 female at [redacted]w[redacted]d here for IOL due to postdates   Plan: 1. Admit to Labor & Delivery - notify attending & anesthesia.   2. Routine admission labs 3. GBS pos, begin prophylaxis 4. Begin cytotec for cervical ripening 5.  Maternal well being: Stable 6.  Fetal well being: stable 7.  Labor management: plan IV pain medication  Beatrix Fetters, CNM

## 2019-04-26 ENCOUNTER — Inpatient Hospital Stay (HOSPITAL_COMMUNITY)
Admission: AD | Admit: 2019-04-26 | Discharge: 2019-04-28 | DRG: 807 | Disposition: A | Discharge status: Home / Self Care | Payer: Medicaid Other | Attending: Obstetrics & Gynecology | Admitting: Obstetrics & Gynecology

## 2019-04-26 ENCOUNTER — Inpatient Hospital Stay (HOSPITAL_COMMUNITY): Payer: Medicaid Other

## 2019-04-26 ENCOUNTER — Other Ambulatory Visit: Payer: Self-pay

## 2019-04-26 ENCOUNTER — Inpatient Hospital Stay (HOSPITAL_COMMUNITY)
Admission: AD | Admit: 2019-04-26 | Payer: BC Managed Care – PPO | Source: Home / Self Care | Admitting: Obstetrics & Gynecology

## 2019-04-26 ENCOUNTER — Encounter (HOSPITAL_COMMUNITY): Payer: Self-pay | Admitting: Obstetrics & Gynecology

## 2019-04-26 DIAGNOSIS — Z3A41 41 weeks gestation of pregnancy: Secondary | ICD-10-CM | POA: Diagnosis not present

## 2019-04-26 DIAGNOSIS — O99824 Streptococcus B carrier state complicating childbirth: Secondary | ICD-10-CM | POA: Diagnosis present

## 2019-04-26 DIAGNOSIS — O9081 Anemia of the puerperium: Secondary | ICD-10-CM | POA: Diagnosis not present

## 2019-04-26 DIAGNOSIS — O480 Post-term pregnancy: Principal | ICD-10-CM | POA: Diagnosis present

## 2019-04-26 LAB — CBC
HCT: 35.5 % — ABNORMAL LOW (ref 36.0–46.0)
Hemoglobin: 11.3 g/dL — ABNORMAL LOW (ref 12.0–15.0)
MCH: 23.6 pg — ABNORMAL LOW (ref 26.0–34.0)
MCHC: 31.8 g/dL (ref 30.0–36.0)
MCV: 74.1 fL — ABNORMAL LOW (ref 80.0–100.0)
Platelets: 239 10*3/uL (ref 150–400)
RBC: 4.79 MIL/uL (ref 3.87–5.11)
RDW: 14.8 % (ref 11.5–15.5)
WBC: 7 10*3/uL (ref 4.0–10.5)
nRBC: 0 % (ref 0.0–0.2)

## 2019-04-26 LAB — TYPE AND SCREEN
ABO/RH(D): O POS
Antibody Screen: NEGATIVE

## 2019-04-26 LAB — RPR: RPR Ser Ql: NONREACTIVE

## 2019-04-26 LAB — ABO/RH: ABO/RH(D): O POS

## 2019-04-26 MED ORDER — OXYTOCIN 40 UNITS IN NORMAL SALINE INFUSION - SIMPLE MED
1.0000 m[IU]/min | INTRAVENOUS | Status: DC
  Administered 2019-04-26: 2 m[IU]/min via INTRAVENOUS
  Filled 2019-04-26: qty 1000

## 2019-04-26 MED ORDER — OXYTOCIN 40 UNITS IN NORMAL SALINE INFUSION - SIMPLE MED: 1.0000 m[IU]/min | INTRAVENOUS | Status: DC

## 2019-04-26 MED ORDER — TERBUTALINE SULFATE 1 MG/ML IJ SOLN: 0.2500 mg | Freq: Once | INTRAMUSCULAR | Status: DC | PRN

## 2019-04-26 MED ORDER — WITCH HAZEL-GLYCERIN EX PADS: 1.0000 "application " | MEDICATED_PAD | CUTANEOUS | Status: DC | PRN

## 2019-04-26 MED ORDER — MISOPROSTOL 25 MCG QUARTER TABLET
25.0000 ug | ORAL_TABLET | ORAL | Status: DC | PRN
  Administered 2019-04-26: 25 ug via VAGINAL
  Filled 2019-04-26: qty 1

## 2019-04-26 MED ORDER — ONDANSETRON HCL 4 MG PO TABS: 4.0000 mg | ORAL_TABLET | ORAL | Status: DC | PRN

## 2019-04-26 MED ORDER — ACETAMINOPHEN 325 MG PO TABS: 650.0000 mg | ORAL_TABLET | ORAL | Status: DC | PRN

## 2019-04-26 MED ORDER — SODIUM CHLORIDE 0.9 % IV SOLN
5.0000 10*6.[IU] | Freq: Once | INTRAVENOUS | Status: AC
  Administered 2019-04-26: 5 10*6.[IU] via INTRAVENOUS
  Filled 2019-04-26: qty 5

## 2019-04-26 MED ORDER — FLEET ENEMA 7-19 GM/118ML RE ENEM: 1.0000 | ENEMA | RECTAL | Status: DC | PRN

## 2019-04-26 MED ORDER — DIBUCAINE (PERIANAL) 1 % EX OINT: 1.0000 "application " | TOPICAL_OINTMENT | CUTANEOUS | Status: DC | PRN

## 2019-04-26 MED ORDER — LACTATED RINGERS IV SOLN: 500.0000 mL | INTRAVENOUS | Status: DC | PRN

## 2019-04-26 MED ORDER — ZOLPIDEM TARTRATE 5 MG PO TABS: 5.0000 mg | ORAL_TABLET | Freq: Every evening | ORAL | Status: DC | PRN

## 2019-04-26 MED ORDER — SOD CITRATE-CITRIC ACID 500-334 MG/5ML PO SOLN
30.0000 mL | ORAL | Status: DC | PRN
  Administered 2019-04-26: 30 mL via ORAL
  Filled 2019-04-26 (×2): qty 30

## 2019-04-26 MED ORDER — LIDOCAINE HCL (PF) 1 % IJ SOLN
30.0000 mL | INTRAMUSCULAR | Status: DC | PRN
  Filled 2019-04-26: qty 30

## 2019-04-26 MED ORDER — ONDANSETRON HCL 4 MG/2ML IJ SOLN: 4.0000 mg | INTRAMUSCULAR | Status: DC | PRN

## 2019-04-26 MED ORDER — OXYTOCIN BOLUS FROM INFUSION
500.0000 mL | Freq: Once | INTRAVENOUS | Status: AC
  Administered 2019-04-26: 500 mL via INTRAVENOUS

## 2019-04-26 MED ORDER — OXYTOCIN 40 UNITS IN NORMAL SALINE INFUSION - SIMPLE MED
2.5000 [IU]/h | INTRAVENOUS | Status: DC
  Administered 2019-04-26: 2.5 [IU]/h via INTRAVENOUS

## 2019-04-26 MED ORDER — BENZOCAINE-MENTHOL 20-0.5 % EX AERO
1.0000 "application " | INHALATION_SPRAY | CUTANEOUS | Status: DC | PRN
  Administered 2019-04-26: 1 via TOPICAL
  Filled 2019-04-26: qty 56

## 2019-04-26 MED ORDER — IBUPROFEN 600 MG PO TABS
600.0000 mg | ORAL_TABLET | Freq: Four times a day (QID) | ORAL | Status: DC
  Administered 2019-04-26 – 2019-04-28 (×8): 600 mg via ORAL
  Filled 2019-04-26 (×8): qty 1

## 2019-04-26 MED ORDER — DIPHENHYDRAMINE HCL 25 MG PO CAPS: 25.0000 mg | ORAL_CAPSULE | Freq: Four times a day (QID) | ORAL | Status: DC | PRN

## 2019-04-26 MED ORDER — PRENATAL MULTIVITAMIN CH
1.0000 | ORAL_TABLET | Freq: Every day | ORAL | Status: DC
  Administered 2019-04-27: 1 via ORAL
  Filled 2019-04-26 (×2): qty 1

## 2019-04-26 MED ORDER — SIMETHICONE 80 MG PO CHEW
80.0000 mg | CHEWABLE_TABLET | ORAL | Status: DC | PRN
  Administered 2019-04-27: 80 mg via ORAL
  Filled 2019-04-26: qty 1

## 2019-04-26 MED ORDER — COCONUT OIL OIL: 1.0000 "application " | TOPICAL_OIL | Status: DC | PRN

## 2019-04-26 MED ORDER — FENTANYL CITRATE (PF) 100 MCG/2ML IJ SOLN
50.0000 ug | INTRAMUSCULAR | Status: DC | PRN
  Administered 2019-04-26 (×2): 50 ug via INTRAVENOUS
  Filled 2019-04-26: qty 2

## 2019-04-26 MED ORDER — ONDANSETRON HCL 4 MG/2ML IJ SOLN: 4.0000 mg | Freq: Four times a day (QID) | INTRAMUSCULAR | Status: DC | PRN

## 2019-04-26 MED ORDER — PENICILLIN G POT IN DEXTROSE 60000 UNIT/ML IV SOLN
3.0000 10*6.[IU] | INTRAVENOUS | Status: DC
  Administered 2019-04-26 (×2): 3 10*6.[IU] via INTRAVENOUS
  Filled 2019-04-26 (×3): qty 50

## 2019-04-26 MED ORDER — TETANUS-DIPHTH-ACELL PERTUSSIS 5-2.5-18.5 LF-MCG/0.5 IM SUSP: 0.5000 mL | Freq: Once | INTRAMUSCULAR | Status: DC

## 2019-04-26 MED ORDER — SENNOSIDES-DOCUSATE SODIUM 8.6-50 MG PO TABS
2.0000 | ORAL_TABLET | ORAL | Status: DC
  Administered 2019-04-27 – 2019-04-28 (×2): 2 via ORAL
  Filled 2019-04-26 (×2): qty 2

## 2019-04-26 MED ORDER — CALCIUM CARBONATE ANTACID 500 MG PO CHEW
400.0000 mg | CHEWABLE_TABLET | Freq: Once | ORAL | Status: AC
  Administered 2019-04-26: 400 mg via ORAL
  Filled 2019-04-26: qty 2

## 2019-04-26 MED ORDER — LACTATED RINGERS IV SOLN: INTRAVENOUS | Status: DC

## 2019-04-26 MED ORDER — ACETAMINOPHEN 325 MG PO TABS
650.0000 mg | ORAL_TABLET | ORAL | Status: DC | PRN
  Administered 2019-04-26: 650 mg via ORAL
  Filled 2019-04-26: qty 2

## 2019-04-26 NOTE — Progress Notes (Signed)
SVD baby girl skin to skin

## 2019-04-26 NOTE — Progress Notes (Signed)
Pt up to BR

## 2019-04-26 NOTE — Progress Notes (Signed)
Pt resting, listening to music with her mother at bedside

## 2019-04-26 NOTE — Progress Notes (Addendum)
  Brooke Horn is a 23 y.o. G1P0 at [redacted]w[redacted]d  IOL post dates Cytotec given x 1 dose, mild cramping GBS pos, receiving 2nd dose of abx now  Subjective: Pt notes mild cramping with cytotec  Objective: BP 128/71   Pulse (!) 59   Temp 98.9 F (37.2 C) (Axillary)   Resp 17   Ht 5\' 9"  (1.753 m)   Wt 123.6 kg   LMP 07/13/2018   BMI 40.23 kg/m   FHT: Cat I tracing UC:  Irregular q 2-3 minutes SVE:   3/80/-2 IBOW    Assessment / Plan: IOL due to post dates  PT may have light breakfast now Begin Pitocin per protocol Declines epidural at this time. Desires IV pain medication PRN AROM when presenting part well applied Anticipate SVD  Beatrix Fetters, CNM

## 2019-04-26 NOTE — Progress Notes (Signed)
First push, CNM at BS, movement noted 

## 2019-04-27 DIAGNOSIS — O9081 Anemia of the puerperium: Secondary | ICD-10-CM | POA: Diagnosis not present

## 2019-04-27 LAB — CBC
HCT: 31.1 % — ABNORMAL LOW (ref 36.0–46.0)
Hemoglobin: 9.6 g/dL — ABNORMAL LOW (ref 12.0–15.0)
MCH: 23.2 pg — ABNORMAL LOW (ref 26.0–34.0)
MCHC: 30.9 g/dL (ref 30.0–36.0)
MCV: 75.3 fL — ABNORMAL LOW (ref 80.0–100.0)
Platelets: 219 10*3/uL (ref 150–400)
RBC: 4.13 MIL/uL (ref 3.87–5.11)
RDW: 14.9 % (ref 11.5–15.5)
WBC: 11.9 10*3/uL — ABNORMAL HIGH (ref 4.0–10.5)
nRBC: 0 % (ref 0.0–0.2)

## 2019-04-27 MED ORDER — POLYSACCHARIDE IRON COMPLEX 150 MG PO CAPS
150.0000 mg | ORAL_CAPSULE | Freq: Every day | ORAL | Status: DC
  Administered 2019-04-27 – 2019-04-28 (×2): 150 mg via ORAL
  Filled 2019-04-27 (×2): qty 1

## 2019-04-27 NOTE — Discharge Summary (Signed)
SVD OB Discharge Summary     Patient Name: Brooke Horn DOB: 1995/07/25 MRN: [id number]  Date of admission: 04/26/2019 Delivering MD: Noralyn Pick  Date of delivery: 04/26/2019 Type of delivery: SVD  Newborn Data: Sex: Baby female Live born female  Birth Weight: 7 lb 3.7 oz (3280 g) APGAR: 9, 9  Newborn Delivery   Birth date/time: 04/26/2019 12:14:00 Delivery type: Vaginal, Spontaneous      Feeding: breast Infant being discharge to home with mother in stable condition.   Admitting diagnosis: Post term pregnancy, 41 weeks [O48.0, Z3A.41] Intrauterine pregnancy: [redacted]w[redacted]d     Secondary diagnosis:  Active Problems:   Post term pregnancy, 41 weeks   Postpartum anemia   SVD (spontaneous vaginal delivery)                                Complications: None                                                              Intrapartum Procedures: spontaneous vaginal delivery Postpartum Procedures: none Complications-Operative and Postpartum: 1st degree perineal laceration Augmentation: AROM, Pitocin and Cytotec   History of Present Illness: Ms. Brooke Horn is a 23 y.o. female, G1P1001, who presents at [redacted]w[redacted]d weeks gestation. The patient has been followed at  [facility] Gynecology  Her pregnancy has been complicated by:  Patient Active Problem List   Diagnosis Date Noted  . SVD (spontaneous vaginal delivery) 04/28/2019  . Postpartum anemia 04/27/2019  . Post term pregnancy, 41 weeks 04/26/2019    Hospital course:  Induction of Labor With Vaginal Delivery   23 y.o. yo G1P1001 at [redacted]w[redacted]d was admitted to the hospital 04/26/2019 for induction of labor.  Indication for induction: Postdates.  Patient had an uncomplicated labor course as follows: Membrane Rupture Time/Date: 12:04 PM ,04/26/2019   Intrapartum Procedures: Episiotomy: None [1]                                         Lacerations:  Labial [10];1st degree [2]  Patient had delivery of a Viable infant.   Information for the patient's newborn:  Brooke, Horn [[id number]]  Delivery Method: Vag-Spont    04/26/2019  Details of delivery can be found in separate delivery note.  Patient had a routine postpartum course. Patient is discharged home 04/28/19. Postpartum Day # 2 : S/P NSVD due to IOL for postdates. Patient up ad lib, denies syncope or dizziness. Reports consuming regular diet without issues and denies N/V. Patient reports 0 bowel movement + passing flatus.  Denies issues with urination and reports bleeding is "lighter."  Patient is breastfeeding and reports going well.  Desires mini pills for postpartum contraception.  Pain is being appropriately managed with use of po meds. Hgb drop to 9.6 on iron, will continue at home, stable, asymptomatic.    Physical exam  Vitals:   04/26/19 1924 04/27/19 0532 04/27/19 1420 04/27/19 2232  BP: 123/87 128/75 116/68 116/68  Pulse: 89 77 70 70  Resp: 18 18 18 18   Temp: 98.8 F (37.1 C) 98.6 F (37 C) 98.2 F (36.8 C) 98.7  F (37.1 C)  TempSrc: Oral Oral Oral Oral  SpO2: 100% 100% 100%   Weight:      Height:       General: alert, cooperative and no distress Lochia: appropriate Uterine Fundus: firm Perineum: approximate, no hematomas noted DVT Evaluation: No evidence of DVT seen on physical exam. Negative Homan's sign. No cords or calf tenderness. No significant calf/ankle edema.  Labs: Lab Results  Component Value Date   WBC 11.9 (H) 04/27/2019   HGB 9.6 (L) 04/27/2019   HCT 31.1 (L) 04/27/2019   MCV 75.3 (L) 04/27/2019   PLT 219 04/27/2019   CMP Latest Ref Rng & Units 08/26/2013  Glucose 70 - 99 mg/dL 981(X)  BUN 6 - 23 mg/dL 12  Creatinine 9.14 - 7.82 mg/dL 9.56  Sodium 213 - 086 mEq/L 140  Potassium 3.7 - 5.3 mEq/L 4.5  Chloride 96 - 112 mEq/L 105  CO2 19 - 32 mEq/L 22  Calcium 8.4 - 10.5 mg/dL 9.6  Total Protein 6.0 - 8.3 g/dL 7.8  Total Bilirubin 0.3 - 1.2 mg/dL <5.7(Q)  Alkaline Phos 47 - 119 U/L 93  AST 0 - 37  U/L 23  ALT 0 - 35 U/L 13    Date of discharge: 04/28/2019 Discharge Diagnoses: Post-date pregnancy Discharge instruction: per After Visit Summary and "Baby and Me Booklet".  After visit meds:   Activity:           unrestricted and pelvic rest Advance as tolerated. Pelvic rest for 6 weeks.  Diet:                routine Medications: PNV, Ibuprofen, Colace and Iron Postpartum contraception: Progesterone only pills Condition:  Pt discharge to home with baby in stable Anemia: Continue iron at home.   Meds: Allergies as of 04/28/2019   No Known Allergies     Medication List    STOP taking these medications   calcium carbonate 500 MG chewable tablet Commonly known as: TUMS - dosed in mg elemental calcium   IRON PO     TAKE these medications   acetaminophen 500 MG tablet Commonly known as: TYLENOL Take 500 mg by mouth every 6 (six) hours as needed for mild pain or headache.   ibuprofen 600 MG tablet Commonly known as: ADVIL Take 1 tablet (600 mg total) by mouth every 6 (six) hours.   iron polysaccharides 150 MG capsule Commonly known as: NIFEREX Take 1 capsule (150 mg total) by mouth daily.   Prenatal Complete 14-0.4 MG Tabs Take 1 tablet by mouth daily.       Discharge Follow Up:  Follow-up Information    [facility]. Schedule an appointment as soon as possible for a visit in 6 week(s).   Specialty: Obstetrics and Gynecology Contact information: [address] [place] [address] [phone]           [place], NP-C, CNM 04/28/2019, 5:08 AM  Dale Belf[place], FNP

## 2019-04-27 NOTE — Progress Notes (Signed)
Subjective: Postpartum Day # 1 : S/P NSVD due to IOL for postdates. Patient up ad lib, denies syncope or dizziness. Reports consuming regular diet without issues and denies N/V. Patient reports 0 bowel movement + passing flatus.  Denies issues with urination and reports bleeding is "lighter."  Patient is breastfeeding and reports going well.  Desires mini pill for postpartum contraception.  Pain is being appropriately managed with use of po med.   2nd laceration Feeding:  breast Contraceptive plan:  Mini pill Baby female  Objective: Vital signs in last 24 hours: Patient Vitals for the past 24 hrs:  BP Temp Temp src Pulse Resp SpO2  04/26/19 1924 123/87 98.8 F (37.1 C) Oral 89 18 100 %  04/26/19 1604 122/81 99.2 F (37.3 C) Oral 88 18 100 %  04/26/19 1452 128/78 99.3 F (37.4 C) Oral 66 18 --  04/26/19 1315 (!) 116/92 -- -- (!) 177 -- --  04/26/19 1300 102/86 -- -- (!) 202 -- --  04/26/19 1245 120/63 -- -- (!) 140 -- --  04/26/19 1231 125/78 -- -- 71 -- --  04/26/19 1225 (!) 131/58 -- -- 73 -- --  04/26/19 1215 -- 98.8 F (37.1 C) Oral -- -- --  04/26/19 1214 (!) 131/106 -- -- 82 -- --  04/26/19 1131 (!) 132/96 -- -- 95 -- --  04/26/19 1100 127/86 -- -- 69 -- --  04/26/19 1032 (!) 104/58 -- -- 94 -- --  04/26/19 1001 119/82 -- -- 82 -- --  04/26/19 0931 (!) 91/41 -- -- 81 -- --  04/26/19 0900 119/76 -- -- 91 -- --  04/26/19 0831 103/78 -- -- 100 -- --  04/26/19 0810 101/67 -- -- 98 -- --  04/26/19 0730 109/75 98 F (36.7 C) Oral 80 -- --  04/26/19 0720 112/61 -- -- 64 -- --  04/26/19 0614 128/71 -- -- (!) 59 17 --     Physical Exam:  General: alert, cooperative, appears stated age and no distress Mood/Affect: Happy Lungs: clear to auscultation, no wheezes, rales or rhonchi, symmetric air entry.  Heart: normal rate, regular rhythm, normal S1, S2, no murmurs, rubs, clicks or gallops. Breast: breasts appear normal, no suspicious masses, no skin or nipple changes or axillary  nodes. Abdomen:  + bowel sounds, soft, non-tender GU: perineum approximate, healing well. No signs of external hematomas.  Uterine Fundus: firm Lochia: appropriate Skin: Warm, Dry. DVT Evaluation: No evidence of DVT seen on physical exam. Negative Homan's sign. No cords or calf tenderness. No significant calf/ankle edema.  CBC Latest Ref Rng & Units 04/26/2019 08/26/2013  WBC 4.0 - 10.5 K/uL 7.0 6.4  Hemoglobin 12.0 - 15.0 g/dL 11.3(L) 10.1(L)  Hematocrit 36.0 - 46.0 % 35.5(L) 32.7(L)  Platelets 150 - 400 K/uL 239 304    No results found for this or any previous visit (from the past 24 hour(s)).   CBG (last 3)  No results for input(s): GLUCAP in the last 72 hours.   I/O last 3 completed shifts: In: -  Out: 350 [Blood:350]   Assessment Postpartum Day # 1 : S/P NSVD due to IOL for posdates. Pt stable. -1 involution. breastfeeding. Hemodynamically stable.   Plan: Continue other mgmt as ordered VTE prophylactics: Early ambulated as tolerates.  Pain control: Motrin/Tylenol PRN Education given regarding options for contraception, including barrier methods, injectable contraception, IUD placement, oral contraceptives.  Plan for discharge tomorrow, Breastfeeding and Lactation consult   Dr. Nelda Marseille to be updated on patient status  Luvenia Starch  Jabe Jeanbaptiste NP-C, CNM 04/27/2019, 5:12 AM

## 2019-04-27 NOTE — Lactation Note (Signed)
This note was copied from a baby's chart. Lactation Consultation Note Baby 28 hrs old at time of consult. Mom BF in cross cradle position. Baby had great body alignment and positioning. Mom denies painful latched. Mom stated comfortable. Adjusted pillows for support.  Mom has everted nipples.  Newborn behavior, STS, I&O, breast massage, supply and demand discussed. Mom has no questions or concerns at this time. Encouraged to call for questions or assistance. Lactation brochure given.  Patient Name: Brooke Horn [id number] Date: 04/27/2019 Reason for consult: Initial assessment;Primapara;Term   Maternal Data Has patient been taught Hand Expression?: Yes Does the patient have breastfeeding experience prior to this delivery?: No  Feeding Feeding Type: Breast Fed  LATCH Score Latch: Grasps breast easily, tongue down, lips flanged, rhythmical sucking.  Audible Swallowing: A few with stimulation  Type of Nipple: Everted at rest and after stimulation  Comfort (Breast/Nipple): Soft / non-tender  Hold (Positioning): No assistance needed to correctly position infant at breast.  LATCH Score: 9  Interventions Interventions: Breast feeding basics reviewed;Support pillows;Breast massage;Breast compression;Hand express  Lactation Tools Discussed/Used     Consult Status Consult Status: Follow-up Date: 04/28/19 Follow-up type: In-patient    Theodoro Kalata 04/27/2019, 4:28 AM

## 2019-04-28 MED ORDER — POLYSACCHARIDE IRON COMPLEX 150 MG PO CAPS: 150.0000 mg | ORAL_CAPSULE | Freq: Every day | ORAL | 2 refills | Status: DC

## 2019-04-28 MED ORDER — IBUPROFEN 600 MG PO TABS: 600.0000 mg | ORAL_TABLET | Freq: Four times a day (QID) | ORAL | 0 refills | Status: DC

## 2019-04-28 NOTE — Lactation Note (Signed)
This note was copied from a baby's chart. Lactation Consultation Note Baby 82 hrs old. Mom states BF well. Baby is cluster feeding. Mom denies issues BF. States she thinks the baby is doing well and having no trouble. Has no questions. Engorgement, management, milk storage, breast care, I&O, supply and demand discussed. Mom has [place], reminded of outside resources. Mom can demonstrated hand expression. Baby latched w/o difficulty in cross-cradle position.  Patient Name: Brooke Horn [id number] Date: 04/28/2019 Reason for consult: Follow-up assessment;Primapara   Maternal Data    Feeding Feeding Type: Breast Fed  LATCH Score Latch: Grasps breast easily, tongue down, lips flanged, rhythmical sucking.  Audible Swallowing: Spontaneous and intermittent  Type of Nipple: Everted at rest and after stimulation  Comfort (Breast/Nipple): Soft / non-tender  Hold (Positioning): No assistance needed to correctly position infant at breast.  LATCH Score: 10  Interventions Interventions: Breast feeding basics reviewed  Lactation Tools Discussed/Used     Consult Status Consult Status: Complete Date: 04/28/19    Theodoro Kalata 04/28/2019, 3:39 AM

## 2021-02-27 IMAGING — US US MFM OB DETAIL +14 WK
1 series · 12 of 28 positions shown · non-contrast
Comparison: none

[Series 1: us mfm ob detail +14 wk · 74 acquisitions, 12 frames shown]
[im 3/74]
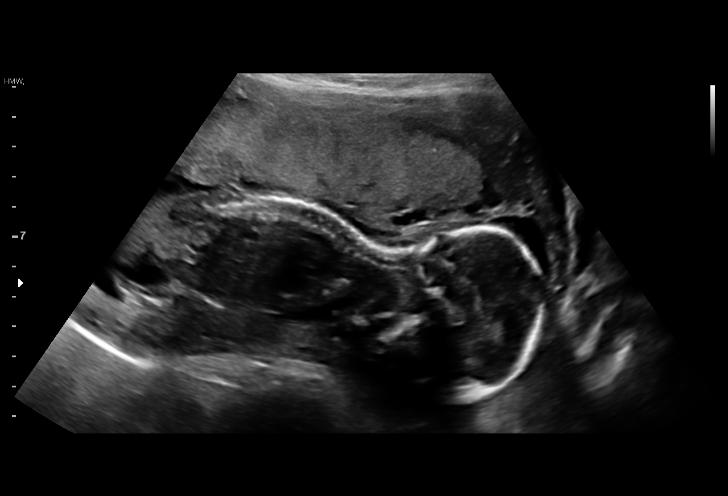
[im 9/74]
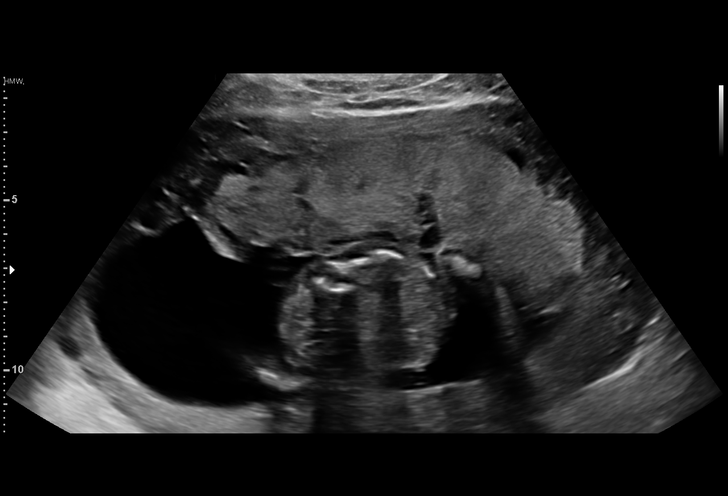
[im 14/74]
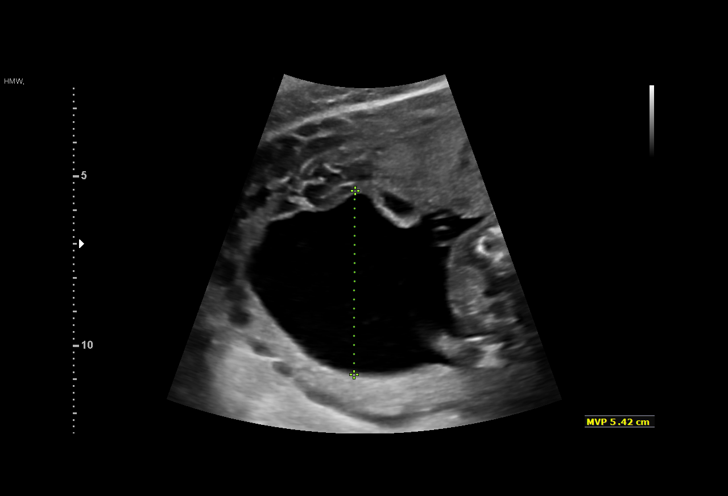
[im 22/74]
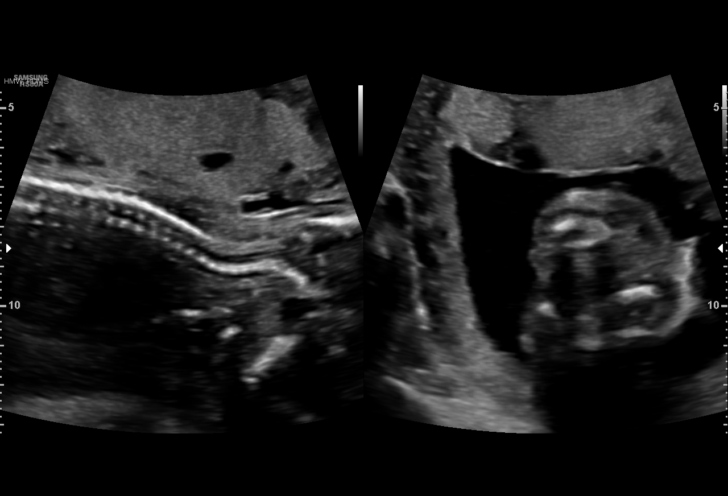
[im 28/74]
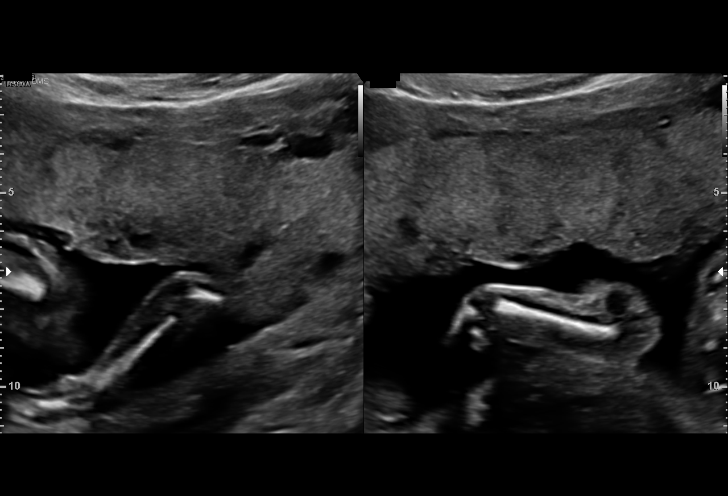
[im 33/74]
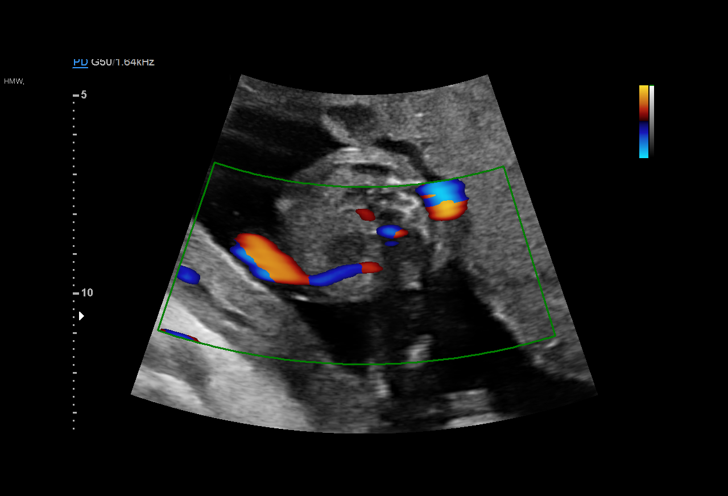
[im 41/74]
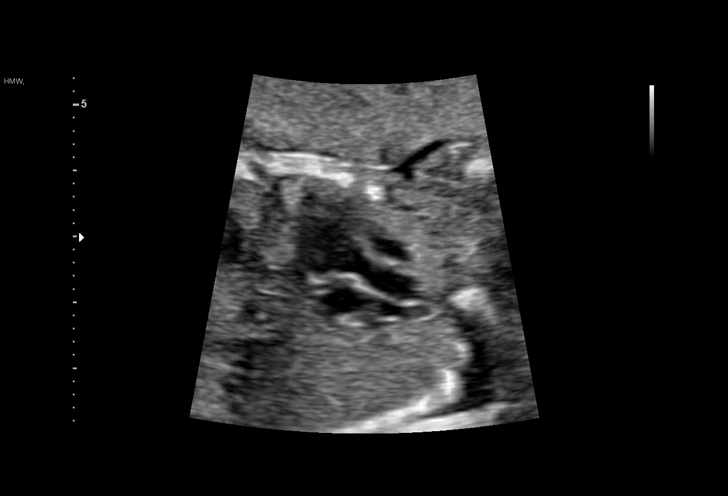
[im 46/74]
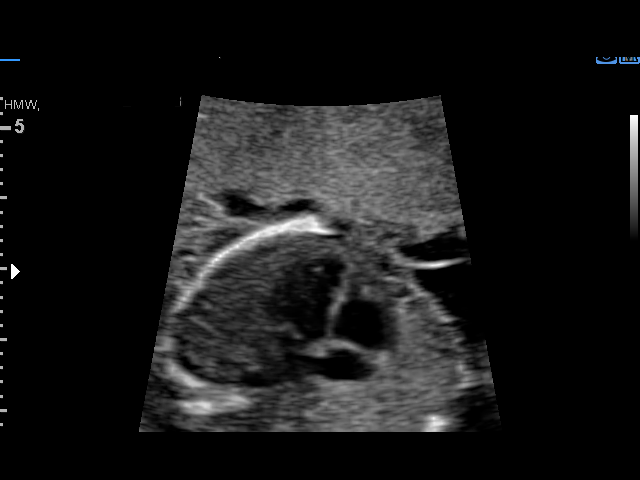
[im 52/74]
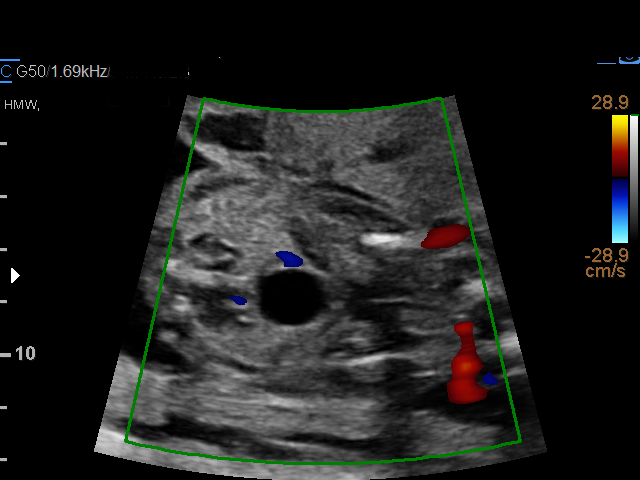
[im 60/74]
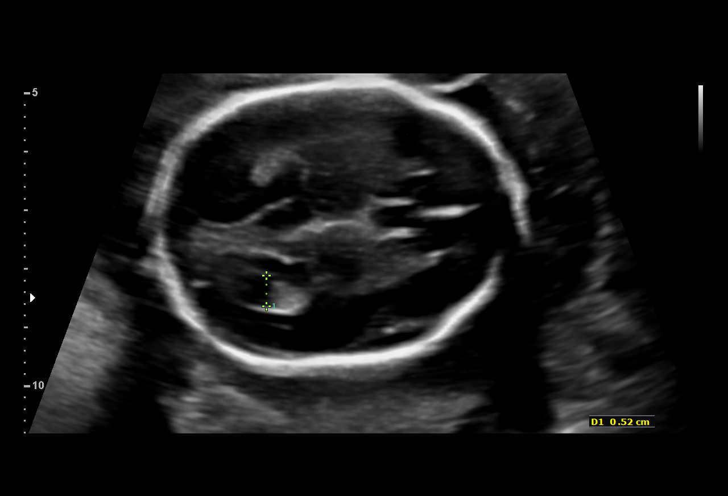
[im 65/74]
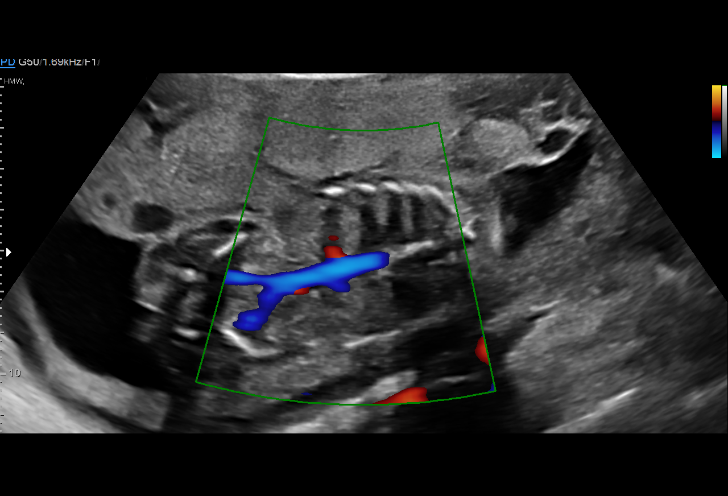
[im 71/74]
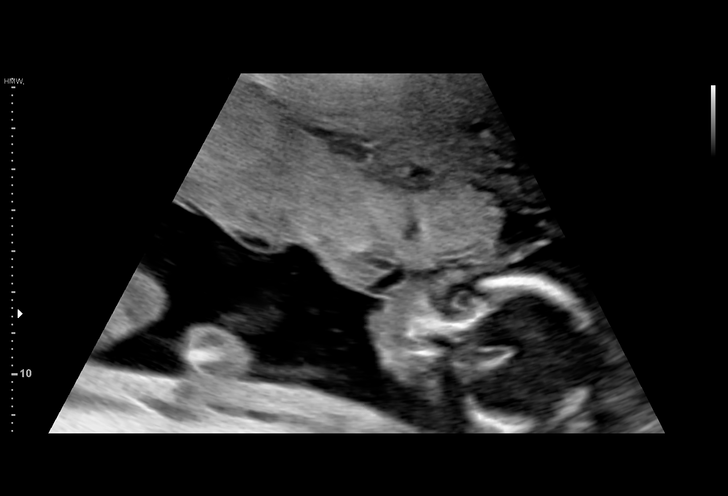

[12 of 28 positions shown; findings below may reference images not displayed]

OB

  1  US MFM OB DETAIL +14 WK              76811.01     KARLISSA POTEAT
 ----------------------------------------------------------------------

 ----------------------------------------------------------------------
Indications

  21 weeks gestation of pregnancy
  Encounter for antenatal screening for
  malformations (low risk NIPS)
  Obesity complicating pregnancy, second
  trimester(pregravid BMI 33.95)
  Uterine size-date discrepancy, second
  trimester
  Fetal abnormality - other known or
  suspected (single umbilical artery)
 ----------------------------------------------------------------------
Fetal Evaluation

 Num Of Fetuses:         1
 Fetal Heart Rate(bpm):  137
 Cardiac Activity:       Observed
 Presentation:           Cephalic
 Placenta:               Anterior
 P. Cord Insertion:      Visualized, central

 Amniotic Fluid
 AFI FV:      Within normal limits

                             Largest Pocket(cm)

Biometry

 BPD:      47.4  mm     G. Age:  20w 2d         14  %    CI:        69.69   %    70 - 86
                                                         FL/HC:      18.4   %    15.9 -
 HC:      181.2  mm     G. Age:  20w 4d         13  %    HC/AC:      1.19        1.06 -
 AC:      152.6  mm     G. Age:  20w 3d         19  %    FL/BPD:     70.3   %
 FL:       33.3  mm     G. Age:  20w 3d         15  %    FL/AC:      21.8   %    20 - 24
 HUM:      33.2  mm     G. Age:  21w 1d         46  %
 CER:      21.3  mm     G. Age:  20w 2d         25  %

 CM:          6  mm

 Est. FW:     355  gm    0 lb 13 oz      11  %
OB History

 Gravidity:    1         Term:   0        Prem:   0        SAB:   0
 TOP:          0       Ectopic:  0        Living: 0
Gestational Age

 LMP:           21w 2d        Date:  07/13/18                 EDD:   04/19/19
 U/S Today:     20w 3d                                        EDD:   04/25/19
 Best:          21w 2d     Det. By:  LMP  (07/13/18)          EDD:   04/19/19
Anatomy

 Cranium:               Appears normal         Aortic Arch:            Not well visualized
 Cavum:                 Appears normal         Ductal Arch:            Not well visualized
 Ventricles:            Appears normal         Diaphragm:              Appears normal
 Choroid Plexus:        Appears normal         Stomach:                Appears normal, left
                                                                       sided
 Cerebellum:            Appears normal         Abdomen:                Appears normal
 Posterior Fossa:       Appears normal         Abdominal Wall:         Appears nml (cord
                                                                       insert, abd wall)
 Nuchal Fold:           Not applicable (>20    Cord Vessels:           2 vessel cord,
                        wks GA)
                                                                       absent right Fv
                                                                       [place]
 Face:                  Orbits nl; profile not Kidneys:                Appear normal
                        well visualized
 Lips:                  Not well visualized    Bladder:                Appears normal
 Thoracic:              Appears normal         Spine:                  Appears normal
 Heart:                 Appears normal         Upper Extremities:      Appears normal
                        (4CH, axis, and
                        situs)
 RVOT:                  Appears normal         Lower Extremities:      Appears normal
 LVOT:                  Appears normal

 Other:  Lt 5th visualized. Heels visualized. Technically difficult due to fetal
         position.
Cervix Uterus Adnexa

 Cervix
 Length:            3.1  cm.
 Normal appearance by transabdominal scan.

 Uterus
 No abnormality visualized.

 Left Ovary
 No adnexal mass visualized.

 Right Ovary
 No adnexal mass visualized.
 Cul De Sac
 No free fluid seen.

 Adnexa
 No abnormality visualized.
Impression

 Ms. Chia, Laionel1 P0, is here for a second opinion. On your
 office ultrasound, small-for-gestational age fetus is seen. On
 cell-free fetal DNA screening, the risks of fetal aneuploidies
 are not increased.
 Patient reports no chronic medical conditions.
 We performed fetal anatomical survey. Fetal biometry is
 consistent with her previously-established dates (lags
 gestational age by 6 days). Single umbilical artery is seen. No
 other markers of fetal aneuploidies or fetal structural defects
 are seen.
 Single umbilical artery: I counseled the patient that SUA is
 seen in about 1% of fetuses. In the absence of other
 anomalies, the risk for aneuploidies is very low. SUA can be
 associated with fetal growth restriction and we recommend
 serial growth scans for fetal growth assessment. The
 association of SUA with cardiac anomalies is not conclusively
 proven. Cardiac anatomy appears normal.
 I also informed her that ultrasound has limitations in detecting
 fetal anomalies.

 Patient later informed that she is aware of the finding of
 single umbilical artery.
Recommendations

 -An appointment was made for her to return in 4 weeks for
 completion of fetal anatomy and assess fetal growth.
 -Serial fetal growth assessments every 4 weeks that may be
 performed at your office.
                 Oxendine, Tanu

## 2021-09-05 DIAGNOSIS — A770 Spotted fever due to Rickettsia rickettsii: Secondary | ICD-10-CM

## 2021-09-05 HISTORY — DX: Spotted fever due to Rickettsia rickettsii: A77.0

## 2021-09-27 ENCOUNTER — Other Ambulatory Visit: Payer: Self-pay

## 2021-09-27 ENCOUNTER — Emergency Department (HOSPITAL_COMMUNITY)
Admission: EM | Admit: 2021-09-27 | Discharge: 2021-09-27 | Disposition: A | Discharge status: Home / Self Care | Payer: Medicaid Other | Attending: Student | Admitting: Student

## 2021-09-27 ENCOUNTER — Encounter (HOSPITAL_COMMUNITY): Payer: Self-pay | Admitting: Emergency Medicine

## 2021-09-27 DIAGNOSIS — I10 Essential (primary) hypertension: Secondary | ICD-10-CM | POA: Insufficient documentation

## 2021-09-27 DIAGNOSIS — R21 Rash and other nonspecific skin eruption: Secondary | ICD-10-CM | POA: Insufficient documentation

## 2021-09-27 DIAGNOSIS — E876 Hypokalemia: Secondary | ICD-10-CM | POA: Diagnosis not present

## 2021-09-27 DIAGNOSIS — Z87891 Personal history of nicotine dependence: Secondary | ICD-10-CM | POA: Diagnosis not present

## 2021-09-27 DIAGNOSIS — R778 Other specified abnormalities of plasma proteins: Secondary | ICD-10-CM | POA: Diagnosis not present

## 2021-09-27 LAB — COMPREHENSIVE METABOLIC PANEL
ALT: 16 U/L (ref 0–44)
AST: 17 U/L (ref 15–41)
Albumin: 3.5 g/dL (ref 3.5–5.0)
Alkaline Phosphatase: 72 U/L (ref 38–126)
Anion gap: 6 (ref 5–15)
BUN: 8 mg/dL (ref 6–20)
CO2: 24 mmol/L (ref 22–32)
Calcium: 8.5 mg/dL — ABNORMAL LOW (ref 8.9–10.3)
Chloride: 109 mmol/L (ref 98–111)
Creatinine, Ser: 0.91 mg/dL (ref 0.44–1.00)
GFR, Estimated: >60 mL/min (ref >60)
Glucose, Bld: 90 mg/dL (ref 70–99)
Potassium: 3.1 mmol/L — ABNORMAL LOW (ref 3.5–5.1)
Sodium: 139 mmol/L (ref 135–145)
Total Bilirubin: 0.4 mg/dL (ref 0.3–1.2)
Total Protein: 7.1 g/dL (ref 6.5–8.1)

## 2021-09-27 LAB — CBC WITH DIFFERENTIAL/PLATELET
Abs Immature Granulocytes: 0.01 10*3/uL (ref 0.00–0.07)
Basophils Absolute: 0 10*3/uL (ref 0.0–0.1)
Basophils Relative: 0 %
Eosinophils Absolute: 0.2 10*3/uL (ref 0.0–0.5)
Eosinophils Relative: 3 %
HCT: 35.8 % — ABNORMAL LOW (ref 36.0–46.0)
Hemoglobin: 11.3 g/dL — ABNORMAL LOW (ref 12.0–15.0)
Immature Granulocytes: 0 %
Lymphocytes Relative: 65 %
Lymphs Abs: 3.2 10*3/uL (ref 0.7–4.0)
MCH: 23.3 pg — ABNORMAL LOW (ref 26.0–34.0)
MCHC: 31.6 g/dL (ref 30.0–36.0)
MCV: 73.7 fL — ABNORMAL LOW (ref 80.0–100.0)
Monocytes Absolute: 0.4 10*3/uL (ref 0.1–1.0)
Monocytes Relative: 7 %
Neutro Abs: 1.3 10*3/uL — ABNORMAL LOW (ref 1.7–7.7)
Neutrophils Relative %: 25 %
Platelets: 260 10*3/uL (ref 150–400)
RBC: 4.86 MIL/uL (ref 3.87–5.11)
RDW: 15.7 % — ABNORMAL HIGH (ref 11.5–15.5)
WBC: 5 10*3/uL (ref 4.0–10.5)
nRBC: 0 % (ref 0.0–0.2)

## 2021-09-27 LAB — RAPID HIV SCREEN (HIV 1/2 AB+AG)
HIV 1/2 Antibodies: NONREACTIVE
HIV-1 P24 Antigen - HIV24: NONREACTIVE

## 2021-09-27 LAB — C-REACTIVE PROTEIN: CRP: 1.6 mg/dL — ABNORMAL HIGH (ref <1.0)

## 2021-09-27 LAB — SEDIMENTATION RATE: Sed Rate: 13 mm/hr (ref 0–22)

## 2021-09-27 MED ORDER — NAPROXEN 250 MG PO TABS
500.0000 mg | ORAL_TABLET | Freq: Once | ORAL | Status: AC
  Administered 2021-09-27: 500 mg via ORAL
  Filled 2021-09-27: qty 2

## 2021-09-27 MED ORDER — PENICILLIN G BENZATHINE 1200000 UNIT/2ML IM SUSY
2.4000 10*6.[IU] | PREFILLED_SYRINGE | Freq: Once | INTRAMUSCULAR | Status: AC
  Administered 2021-09-27: 2.4 10*6.[IU] via INTRAMUSCULAR
  Filled 2021-09-27: qty 4

## 2021-09-27 MED ORDER — DOXYCYCLINE HYCLATE 100 MG PO CAPS: 100.0000 mg | ORAL_CAPSULE | Freq: Two times a day (BID) | ORAL | 0 refills | Status: DC

## 2021-09-27 MED ORDER — NAPROXEN 375 MG PO TABS: 375.0000 mg | ORAL_TABLET | Freq: Two times a day (BID) | ORAL | 0 refills | Status: DC

## 2021-09-27 MED ORDER — DOXYCYCLINE HYCLATE 100 MG PO TABS
100.0000 mg | ORAL_TABLET | Freq: Once | ORAL | Status: AC
  Administered 2021-09-27: 100 mg via ORAL
  Filled 2021-09-27: qty 1

## 2021-09-27 NOTE — ED Provider Notes (Signed)
[facility] EMERGENCY DEPARTMENT Provider Note  CSN: [id number] Arrival date & time: 09/27/21 1433  Chief Complaint(s) Rash  HPI Brooke Horn is a 26 y.o. female with PMH HTN who presents emergency department for evaluation of a rash.  Patient states that last week she was bit by a tick and this was removed.  She did not see an engorged tick but said that the tick was brown in color, no white spot.  She states over the last 3 days she has had a progressive rash that started on the soles of her feet with multiple painful nonpruritic none coalescing erythematous papules and this rash has since spread to the hands.  She saw her primary care physician who was initially possibly concerned about hand-foot-and-mouth but her child at home does not have any symptoms and she has no oropharyngeal symptoms today.  As the symptoms have spread to her hands she came to the emergency department for evaluation.  She denies any additional symptoms including fever, chest pain, shortness of breath, abdominal pain or nausea, vomiting or other systemic symptoms.  No vaginal bleeding or discharge.   Past Medical History Past Medical History:  Diagnosis Date   Hypertension    Patient Active Problem List   Diagnosis Date Noted   SVD (spontaneous vaginal delivery) 04/28/2019   Postpartum anemia 04/27/2019   Post term pregnancy, 41 weeks 04/26/2019   Home Medication(s) Prior to Admission medications   Medication Sig Start Date End Date Taking? Authorizing Provider  doxycycline (VIBRAMYCIN) 100 MG capsule Take 1 capsule (100 mg total) by mouth 2 (two) times daily. 09/27/21  Yes Jackqulyn Mendel, MD  naproxen (NAPROSYN) 375 MG tablet Take 1 tablet (375 mg total) by mouth 2 (two) times daily. 09/27/21  Yes Morrell Fluke, MD  acetaminophen (TYLENOL) 500 MG tablet Take 500 mg by mouth every 6 (six) hours as needed for mild pain or headache.    [provider]  ibuprofen (ADVIL) 600 MG tablet  Take 1 tablet (600 mg total) by mouth every 6 (six) hours. 04/28/19   Dale [place], FNP  iron polysaccharides (NIFEREX) 150 MG capsule Take 1 capsule (150 mg total) by mouth daily. 04/28/19   Dale [place], FNP  Prenatal Vit-Fe Fumarate-FA (PRENATAL COMPLETE) 14-0.4 MG TABS Take 1 tablet by mouth daily. 08/19/18   Eyvonne Mechanic, PA-C                                                                                                                                    Past Surgical History Past Surgical History:  Procedure Laterality Date   WISDOM TOOTH EXTRACTION     Family History Family History  Problem Relation Age of Onset   Cancer Mother    Diabetes Maternal Grandmother    Hypertension Maternal Grandmother     Social History Social History   Tobacco Use   Smoking status: Former   Smokeless tobacco: Never  Vaping Use   Vaping Use: Former  Substance Use Topics   Alcohol use: Not Currently   Drug use: Not Currently   Allergies Patient has no known allergies.  Review of Systems Review of Systems  Skin:  Positive for rash.   Physical Exam Vital Signs  I have reviewed the triage vital signs BP 99/73   Pulse 72   Temp 97.9 F (36.6 C) (Oral)   Resp 14   LMP 09/22/2021   SpO2 100%   Physical Exam Vitals and nursing note reviewed.  Constitutional:      General: She is not in acute distress.    Appearance: She is well-developed.  HENT:     Head: Normocephalic and atraumatic.  Eyes:     Conjunctiva/sclera: Conjunctivae normal.  Cardiovascular:     Rate and Rhythm: Normal rate and regular rhythm.     Heart sounds: No murmur heard. Pulmonary:     Effort: Pulmonary effort is normal. No respiratory distress.     Breath sounds: Normal breath sounds.  Abdominal:     Palpations: Abdomen is soft.     Tenderness: There is no abdominal tenderness.  Musculoskeletal:        General: No swelling.     Cervical back: Neck supple.  Skin:    General: Skin is warm and dry.      Capillary Refill: Capillary refill takes less than 2 seconds.     Findings: Rash present.  Neurological:     Mental Status: She is alert.  Psychiatric:        Mood and Affect: Mood normal.               ED Results and Treatments Labs (all labs ordered are listed, but only abnormal results are displayed) Labs Reviewed  COMPREHENSIVE METABOLIC PANEL - Abnormal; Notable for the following components:      Result Value   Potassium 3.1 (*)    Calcium 8.5 (*)    All other components within normal limits  CBC WITH DIFFERENTIAL/PLATELET - Abnormal; Notable for the following components:   Hemoglobin 11.3 (*)    HCT 35.8 (*)    MCV 73.7 (*)    MCH 23.3 (*)    RDW 15.7 (*)    Neutro Abs 1.3 (*)    All other components within normal limits  C-REACTIVE PROTEIN - Abnormal; Notable for the following components:   CRP 1.6 (*)    All other components within normal limits  RAPID HIV SCREEN (HIV 1/2 AB+AG)  SEDIMENTATION RATE  ROCKY MTN SPOTTED FVR ABS PNL(IGG+IGM)  RPR                                                                                                                          Radiology No results found.  Pertinent labs & imaging results that were available during my care of the patient were reviewed by me and considered in my medical decision making (see MDM for details).  Medications Ordered in ED Medications  penicillin g benzathine (BICILLIN LA) 1200000 UNIT/2ML injection 2.4 Million Units (has no administration in time range)  doxycycline (VIBRA-TABS) tablet 100 mg (has no administration in time range)  naproxen (NAPROSYN) tablet 500 mg (500 mg Oral Given 09/27/21 2216)                                                                                                                                     Procedures Procedures  (including critical care time)  Medical Decision Making / ED Course   This patient presents to the ED for concern of rash, this  involves an extensive number of treatment options, and is a complaint that carries with it a high risk of complications and morbidity.  The differential diagnosis includes recommend spotted fever, secondary syphilis, contact dermatitis, hand-foot-and-mouth  MDM: Patient seen in the emergency room for evaluation of a rash.  Physical exam reveals erythematous none coalescing papules that are tender to palpation on the soles and palms but are found nowhere else on the body.  Physical exam otherwise unremarkable.  Laboratory evaluation with hypokalemia to 3.1, hemoglobin 11.3 with an MCV of 73.7, CRP mildly elevated at 1.6, HIV negative.  RPR and [facility] spotted fever labs are pending.  As these will not return tonight, patient will be empirically treated for both secondary syphilis and [facility] spotted fever and she received 2,400,000 units of IM Bicillin and 100 mg of doxycycline.  We will place the patient on a 10-day course of doxycycline and patient will receive Naprosyn for pain control.  She was instructed to call her dermatologist and asked for a more urgent follow-up.  Patient given strict return precautions which she voiced understanding and we will follow-up on RPR and [facility] spotted fever labs.   Additional history obtained:  -External records from outside source obtained and reviewed including: Chart review including previous notes, labs, imaging, consultation notes   Lab Tests: -I ordered, reviewed, and interpreted labs.   The pertinent results include:   Labs Reviewed  COMPREHENSIVE METABOLIC PANEL - Abnormal; Notable for the following components:      Result Value   Potassium 3.1 (*)    Calcium 8.5 (*)    All other components within normal limits  CBC WITH DIFFERENTIAL/PLATELET - Abnormal; Notable for the following components:   Hemoglobin 11.3 (*)    HCT 35.8 (*)    MCV 73.7 (*)    MCH 23.3 (*)    RDW 15.7 (*)    Neutro Abs 1.3 (*)    All other components within  normal limits  C-REACTIVE PROTEIN - Abnormal; Notable for the following components:   CRP 1.6 (*)    All other components within normal limits  RAPID HIV SCREEN (HIV 1/2 AB+AG)  SEDIMENTATION RATE  ROCKY MTN SPOTTED FVR ABS PNL(IGG+IGM)  RPR      Medicines ordered and prescription drug  management: Meds ordered this encounter  Medications   naproxen (NAPROSYN) tablet 500 mg   penicillin g benzathine (BICILLIN LA) 1200000 UNIT/2ML injection 2.4 Million Units    Order Specific Question:   Antibiotic Indication:    Answer:   Syphilis   doxycycline (VIBRA-TABS) tablet 100 mg   naproxen (NAPROSYN) 375 MG tablet    Sig: Take 1 tablet (375 mg total) by mouth 2 (two) times daily.    Dispense:  20 tablet    Refill:  0   doxycycline (VIBRAMYCIN) 100 MG capsule    Sig: Take 1 capsule (100 mg total) by mouth 2 (two) times daily.    Dispense:  20 capsule    Refill:  0    -I have reviewed the patients home medicines and have made adjustments as needed  Critical interventions none   Cardiac Monitoring: The patient was maintained on a cardiac monitor.  I personally viewed and interpreted the cardiac monitored which showed an underlying rhythm of: NSR  Social Determinants of Health:  Factors impacting patients care include: none   Reevaluation: After the interventions noted above, I reevaluated the patient and found that they have :improved  Co morbidities that complicate the patient evaluation  Past Medical History:  Diagnosis Date   Hypertension       Dispostion: I considered admission for this patient, but her rash currently does not meet inpatient criteria for admission and she will be discharged with outpatient follow-up     Final Clinical Impression(s) / ED Diagnoses Final diagnoses:  Rash     @PCDICTATION @    Glendora ScoreKommor, Ronie Fleeger, MD 09/27/21 2336

## 2021-09-27 NOTE — ED Triage Notes (Signed)
Patient here with complaint of painful rash on bilateral feet that appeared Sunday and has gotten progressively worse and appears to now be spreading her palms. Patient denies shortness of breath, is alert, oriented, and in no apparent distress at this time.

## 2021-09-27 NOTE — ED Provider Triage Note (Signed)
Emergency Medicine Provider Triage Evaluation Note  Brooke Horn , a 26 y.o. female  was evaluated in triage.  Pt complains of swelling, pain and rash to bilateral feet.  Began 2 days ago.  No specific trigger that she is aware of.  Symptoms are making it difficult for her to ambulate.  Denies rash elsewhere on her body or environmental exposures.  No sick contacts with similar symptoms.  Review of Systems  Positive: Rash, bilateral foot swelling Negative: Lip or tongue swelling  Physical Exam  BP (!) 147/95 (BP Location: Right Arm)   Pulse 95   Temp 97.9 F (36.6 C) (Oral)   Resp 16   SpO2 100%  Gen:   Awake, no distress   Resp:  Normal effort  MSK:   Moves extremities without difficulty  Other:      Medical Decision Making  Medically screening exam initiated at 3:13 PM.  Appropriate orders placed.  Jeraldean Derman was informed that the remainder of the evaluation will be completed by another provider, this initial triage assessment does not replace that evaluation, and the importance of remaining in the ED until their evaluation is complete.     Delia Heady, PA-C 09/27/21 1514

## 2021-09-28 LAB — RPR: RPR Ser Ql: NONREACTIVE

## 2021-09-29 ENCOUNTER — Telehealth (HOSPITAL_COMMUNITY): Payer: Self-pay

## 2021-09-30 LAB — RMSF, IGG, IFA: RMSF, IGG, IFA: 1:64 {titer} — ABNORMAL HIGH

## 2021-09-30 LAB — ROCKY MTN SPOTTED FVR ABS PNL(IGG+IGM)
RMSF IgG: POSITIVE — AB
RMSF IgM: 0.77 index (ref 0.00–0.89)

## 2022-08-28 ENCOUNTER — Other Ambulatory Visit: Payer: Self-pay | Admitting: Obstetrics and Gynecology

## 2022-08-28 DIAGNOSIS — R102 Pelvic and perineal pain: Secondary | ICD-10-CM

## 2022-08-31 ENCOUNTER — Ambulatory Visit
Admission: RE | Admit: 2022-08-31 | Discharge: 2022-08-31 | Disposition: A | Discharge status: Home / Self Care | Payer: Medicaid Other | Source: Ambulatory Visit | Attending: Obstetrics and Gynecology | Admitting: Obstetrics and Gynecology

## 2022-08-31 DIAGNOSIS — R102 Pelvic and perineal pain: Secondary | ICD-10-CM

## 2022-09-29 ENCOUNTER — Ambulatory Visit: Payer: Medicaid Other | Admitting: Podiatry

## 2022-09-29 ENCOUNTER — Encounter: Payer: Self-pay | Admitting: Podiatry

## 2022-09-29 ENCOUNTER — Telehealth: Payer: Self-pay | Admitting: Podiatry

## 2022-09-29 DIAGNOSIS — L600 Ingrowing nail: Secondary | ICD-10-CM

## 2022-09-29 NOTE — Patient Instructions (Signed)

## 2022-09-29 NOTE — Telephone Encounter (Signed)
Patient called stating she left the office 20 minutes ago and had a toenail procedure performed. She bled through the bandage and is "leaking all over the car" and can't get it to stop.   She was advised to come back to the office right now.  She states she can't get here until 1:15.  Advised her to call the office when she arrives, because she was concerned about tracking blood on our floor.  Informed her we could try to have a team member meet her outside and bring her in via wheelchair to change her address and assess the bleeding.  -Julez Huseby

## 2022-09-29 NOTE — Telephone Encounter (Signed)
That's fine. She just needs some compression

## 2022-10-03 NOTE — Progress Notes (Signed)
Subjective:   Patient ID: Brooke Horn, female   DOB: 27 y.o.   MRN: [id number]   HPI Patient presents with a damaged right big toenail that is been going on for around 5 years and she had tried oral medication without success it is thick dystrophic and can become painful.  Patient does not smoke likes to be active   Review of Systems  All other systems reviewed and are negative.       Objective:  Physical Exam Vitals and nursing note reviewed.  Constitutional:      Appearance: She is well-developed.  Pulmonary:     Effort: Pulmonary effort is normal.  Musculoskeletal:        General: Normal range of motion.  Skin:    General: Skin is warm.  Neurological:     Mental Status: She is alert.     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  I did explain the risk associated with surgery she is willing to accept this and signed consent form understanding this is a permanent procedure and today I infiltrated the right big toe 60 mg like Marcaine mixture sterile prep done using sterile instrumentation remove the hallux nail exposed matrix applied phenol for applications 30 seconds followed by alcohol lavage sterile dressing gave instructions on soaks wear dressing 24 hours take off earlier if throbbing were to occur and encouraged her to

## 2022-10-05 ENCOUNTER — Telehealth: Payer: Self-pay | Admitting: Podiatry

## 2022-10-05 NOTE — Telephone Encounter (Signed)
Patient called and needs appt.    She was in last week and had her right great toenail removed.  Still having pain, but we talked about what to do and how to get more relief (soaks, ice pack, ibuprofen).  However, she stubbed her LEFT great toe when going up the steps and now that toe is really hurting and it's bleeding from around the nail.  She will need to be seen for this concern.  Also, since both toes are hurting, she stated she is filing an insurance claim and will bring the form in to be completed.  .  Please contact patient to schedule appointment for LEFT great toe / toenail injury.  Thanks, Kelina Beauchamp

## 2022-10-06 NOTE — Telephone Encounter (Signed)
Pt left message yesterday  5.30.2024 at 1041am transferred from triage to schedule an appt.  I returned call and left message for pt to call to schedule an appt.

## 2022-10-09 ENCOUNTER — Other Ambulatory Visit (HOSPITAL_COMMUNITY): Payer: Self-pay | Admitting: Gastroenterology

## 2022-10-09 DIAGNOSIS — R1013 Epigastric pain: Secondary | ICD-10-CM

## 2022-10-17 ENCOUNTER — Ambulatory Visit: Payer: Medicaid Other | Admitting: Podiatry

## 2022-10-17 DIAGNOSIS — S90212A Contusion of left great toe with damage to nail, initial encounter: Secondary | ICD-10-CM

## 2022-10-17 NOTE — Progress Notes (Signed)
       Subjective:  Patient ID: Brooke Horn, female    DOB: January 26, 1996,  MRN: [id number]  Chief Complaint  Patient presents with   Ingrown Toenail    Rm 11 Nail check of right great ingrown removal and check of left great nail. Pt states she fell and snagged her left great nail 2 weeks ago and now her nails detached from the nail bed a little.      Brooke Horn presents to clinic today for f/u of avulsion of the right hallux nail.  The day of her procedure she did have to return to the clinic due to dressing bleedthrough.  This was changed uneventfully at the office and she was discharged that day.  She states that the nailbed area is no longer draining.  She has minimal discomfort to the area.  She notes that when she fell and smacked her left great toenail 2 weeks ago it has been loose ever since.  She does not feel it needs to be removed today.  She feels that the nail could possibly just be trimmed back to prevent the nail from catching on her socks and ripping away.  Denies any drainage.  There is some tenderness to the nail.  PCP is Levan Hurst, Anastasia Fiedler, FNP.  No Known Allergies  Objective:  There were no vitals filed for this visit.  Vascular Examination: Capillary refill time is 3-5 seconds to toes bilateral. Palpable pedal pulses b/l LE. Digital hair present b/l. No pedal edema b/l. Skin temperature gradient WNL b/l. No varicosities b/l. No cyanosis or clubbing noted b/l.   Dermatological Examination: Upon inspection of the right hallux nail avulsion site, there are no clinical signs of infection.  No purulence, no necrosis, no malodor present.  No erythema present.  Eschar formed along nail bed.  Minimal to no pain on palpation of area.  No active drainage seen.  The left hallux nail is long and lytic distally.  The exposed nailbed which can be visualized is dry and stable.  There is no dried blood present.  The nail is incurvated along the margins but there is no evidence of  paronychia.  Assessment/Plan: 1. Contusion of left great toe with damage to nail, initial encounter     Informed the patient that the avulsion site to the right great toenail looks good with no signs of infection.  If the area is still tender she can wear a light bandage or dry Band-Aid over the toe when enclosed shoes.  When she is at home or sleeping no dressing is needed at this time.  She can discontinue Epsom salt soaks.  The left hallux nail was cut back 75% to the eponychium uneventfully.  The area appears stable.  Informed the patient that new nail will continue to grow and we will see if this grows back in a more normal shape and reattached.  No further treatment needed.  Return if symptoms worsen or fail to improve.   Clerance Lav, DPM, FACFAS [facility]     [address] [place], [place] [address]                Office [phone]  Fax [phone]

## 2022-10-24 ENCOUNTER — Encounter (HOSPITAL_COMMUNITY): Payer: Self-pay

## 2022-10-24 ENCOUNTER — Ambulatory Visit (HOSPITAL_COMMUNITY): Payer: Medicaid Other

## 2022-10-31 ENCOUNTER — Encounter (HOSPITAL_COMMUNITY)
Admission: RE | Admit: 2022-10-31 | Discharge: 2022-10-31 | Disposition: A | Discharge status: Home / Self Care | Payer: Medicaid Other | Source: Ambulatory Visit | Attending: Gastroenterology | Admitting: Gastroenterology

## 2022-10-31 DIAGNOSIS — R1013 Epigastric pain: Secondary | ICD-10-CM

## 2022-10-31 MED ORDER — TECHNETIUM TC 99M MEBROFENIN IV KIT
5.1000 | PACK | Freq: Once | INTRAVENOUS | Status: AC | PRN
  Administered 2022-10-31: 5.1 via INTRAVENOUS

## 2022-12-28 ENCOUNTER — Ambulatory Visit: Payer: Self-pay | Admitting: Surgery

## 2023-02-23 ENCOUNTER — Ambulatory Visit: Admit: 2023-02-23 | Payer: Medicaid Other | Admitting: Surgery

## 2023-02-23 SURGERY — LAPAROSCOPIC CHOLECYSTECTOMY
Anesthesia: General

## 2023-05-10 ENCOUNTER — Encounter (HOSPITAL_BASED_OUTPATIENT_CLINIC_OR_DEPARTMENT_OTHER): Payer: Self-pay | Admitting: Emergency Medicine

## 2023-05-10 ENCOUNTER — Emergency Department (HOSPITAL_BASED_OUTPATIENT_CLINIC_OR_DEPARTMENT_OTHER): Payer: Medicaid Other | Admitting: Radiology

## 2023-05-10 ENCOUNTER — Other Ambulatory Visit (HOSPITAL_BASED_OUTPATIENT_CLINIC_OR_DEPARTMENT_OTHER): Payer: Self-pay

## 2023-05-10 ENCOUNTER — Emergency Department (HOSPITAL_BASED_OUTPATIENT_CLINIC_OR_DEPARTMENT_OTHER)
Admission: EM | Admit: 2023-05-10 | Discharge: 2023-05-10 | Disposition: A | Discharge status: Home / Self Care | Payer: Medicaid Other | Attending: Emergency Medicine | Admitting: Emergency Medicine

## 2023-05-10 ENCOUNTER — Other Ambulatory Visit: Payer: Self-pay

## 2023-05-10 DIAGNOSIS — R0789 Other chest pain: Secondary | ICD-10-CM | POA: Insufficient documentation

## 2023-05-10 LAB — TROPONIN I (HIGH SENSITIVITY)
Troponin I (High Sensitivity): <2 ng/L (ref <18)
Troponin I (High Sensitivity): <2 ng/L (ref <18)

## 2023-05-10 LAB — CBC
HCT: 34.9 % — ABNORMAL LOW (ref 36.0–46.0)
Hemoglobin: 11.1 g/dL — ABNORMAL LOW (ref 12.0–15.0)
MCH: 23 pg — ABNORMAL LOW (ref 26.0–34.0)
MCHC: 31.8 g/dL (ref 30.0–36.0)
MCV: 72.3 fL — ABNORMAL LOW (ref 80.0–100.0)
Platelets: 226 10*3/uL (ref 150–400)
RBC: 4.83 MIL/uL (ref 3.87–5.11)
RDW: 15.3 % (ref 11.5–15.5)
WBC: 5.3 10*3/uL (ref 4.0–10.5)
nRBC: 0 % (ref 0.0–0.2)

## 2023-05-10 LAB — HEPATIC FUNCTION PANEL
ALT: 18 U/L (ref 0–44)
AST: 15 U/L (ref 15–41)
Albumin: 4.2 g/dL (ref 3.5–5.0)
Alkaline Phosphatase: 69 U/L (ref 38–126)
Bilirubin, Direct: 0.1 mg/dL (ref 0.0–0.2)
Indirect Bilirubin: 0.3 mg/dL (ref 0.3–0.9)
Total Bilirubin: 0.4 mg/dL (ref 0.0–1.2)
Total Protein: 7.6 g/dL (ref 6.5–8.1)

## 2023-05-10 LAB — BASIC METABOLIC PANEL
Anion gap: 6 (ref 5–15)
BUN: 13 mg/dL (ref 6–20)
CO2: 25 mmol/L (ref 22–32)
Calcium: 9.1 mg/dL (ref 8.9–10.3)
Chloride: 106 mmol/L (ref 98–111)
Creatinine, Ser: 0.91 mg/dL (ref 0.44–1.00)
GFR, Estimated: >60 mL/min (ref >60)
Glucose, Bld: 96 mg/dL (ref 70–99)
Potassium: 3.7 mmol/L (ref 3.5–5.1)
Sodium: 137 mmol/L (ref 135–145)

## 2023-05-10 LAB — D-DIMER, QUANTITATIVE: D-Dimer, Quant: 0.47 ug{FEU}/mL (ref 0.00–0.50)

## 2023-05-10 LAB — PREGNANCY, URINE: Preg Test, Ur: NEGATIVE

## 2023-05-10 MED ORDER — LIDOCAINE 5 % EX PTCH
1.0000 | MEDICATED_PATCH | CUTANEOUS | 0 refills | Status: AC
  Filled 2023-05-10: qty 14, 14d supply, fill #0

## 2023-05-10 MED ORDER — LIDOCAINE 5 % EX PTCH
1.0000 | MEDICATED_PATCH | CUTANEOUS | Status: DC
  Administered 2023-05-10: 1 via TRANSDERMAL
  Filled 2023-05-10: qty 1

## 2023-05-10 MED ORDER — MELOXICAM 7.5 MG PO TABS
7.5000 mg | ORAL_TABLET | Freq: Every day | ORAL | 0 refills | Status: AC
  Filled 2023-05-10: qty 10, 10d supply, fill #0

## 2023-05-10 NOTE — ED Triage Notes (Signed)
 Pt c/o LT side CP radiating to posterior LT side back and shoulder, shob x 1 week.  Endorses nausea. Pt has cholecystectomy sched 1/14

## 2023-05-10 NOTE — ED Provider Notes (Signed)
 [place] EMERGENCY DEPARTMENT AT [facility] Provider Note   CSN: [id number] Arrival date & time: 05/10/23  1009     History  Chief Complaint  Patient presents with   Chest Pain    Brooke Horn is a 28 y.o. female.  Patient with history of biliary dyskinesia presents to the emergency department for evaluation of chest pain.  Patient has had worsening upper and mid chest pain to left-sided chest pain over the past 1 week.  She describes the pain initially as dull but now more sharp.  It is worse with movement of her upper extremities and deep breathing.  No shortness of breath, cough or hemoptysis.  No fevers.  She states that sometimes it does feel worse after she eats or drinks.  She had nausea and vomiting last week but it has not been persistent.  She has used topical medications without improvement.  No Tylenol  or ibuprofen . Patient denies risk factors for pulmonary embolism including: unilateral leg swelling, history of DVT/PE/other blood clots, use of exogenous hormones, recent immobilizations, recent surgery, recent travel (>4hr segment), malignancy, hemoptysis.   She was initially supposed to get her gallbladder out in October, but was rescheduled due to a new job.  She is scheduled for January 14.  States that she was told to come in to make sure that she did not have an infection.         Home Medications Prior to Admission medications   Not on File      Allergies    Patient has no known allergies.    Review of Systems   Review of Systems  Physical Exam Updated Vital Signs BP 121/75 (BP Location: Right Arm)   Pulse 65   Temp 97.8 F (36.6 C) (Oral)   Resp 20   Wt 120.2 kg   LMP 04/11/2023   SpO2 100%   BMI 39.13 kg/m  Physical Exam Vitals and nursing note reviewed.  Constitutional:      Appearance: She is well-developed. She is not diaphoretic.  HENT:     Head: Normocephalic and atraumatic.     Mouth/Throat:     Mouth: Mucous membranes are  not dry.  Eyes:     Conjunctiva/sclera: Conjunctivae normal.  Neck:     Vascular: Normal carotid pulses. No JVD.     Trachea: Trachea normal. No tracheal deviation.  Cardiovascular:     Rate and Rhythm: Normal rate and regular rhythm.     Pulses: No decreased pulses.          Radial pulses are 2+ on the right side and 2+ on the left side.     Heart sounds: Normal heart sounds, S1 normal and S2 normal. No murmur heard. Pulmonary:     Effort: Pulmonary effort is normal. No respiratory distress.     Breath sounds: No wheezing.  Chest:     Chest wall: Tenderness present.       Comments: No skin rashes or skin changes.  Patient is very tender and winces when I press over the sternum area to the left upper chest wall.  No ecchymosis or deformities. Abdominal:     General: Bowel sounds are normal.     Palpations: Abdomen is soft.     Tenderness: There is no abdominal tenderness. There is no guarding or rebound.  Musculoskeletal:        General: Normal range of motion.     Cervical back: Normal range of motion and neck supple. No muscular  tenderness.  Skin:    General: Skin is warm and dry.     Coloration: Skin is not pale.  Neurological:     Mental Status: She is alert.     ED Results / Procedures / Treatments   Labs (all labs ordered are listed, but only abnormal results are displayed) Labs Reviewed  CBC - Abnormal; Notable for the following components:      Result Value   Hemoglobin 11.1 (*)    HCT 34.9 (*)    MCV 72.3 (*)    MCH 23.0 (*)    All other components within normal limits  BASIC METABOLIC PANEL  PREGNANCY, URINE  HEPATIC FUNCTION PANEL  D-DIMER, QUANTITATIVE  TROPONIN I (HIGH SENSITIVITY)  TROPONIN I (HIGH SENSITIVITY)    ED ECG REPORT   Date: 05/10/2023  Rate: 63  Rhythm: normal sinus rhythm  QRS Axis: normal  Intervals: normal  ST/T Wave abnormalities: normal  Conduction Disutrbances:none  Narrative Interpretation:   Old EKG Reviewed: changes  noted, slower today from 2015  I have personally reviewed the EKG tracing and agree with the computerized printout as noted.   Radiology No results found.  Procedures Procedures    Medications Ordered in ED Medications - No data to display  ED Course/ Medical Decision Making/ A&P    Patient seen and examined. History obtained directly from patient. Work-up including labs, imaging, EKG ordered in triage, if performed, were reviewed.    Labs/EKG: Independently reviewed and interpreted.  This included: CBC mild anemia, chronic, otherwise unremarkable; BMP unremarkable; troponin less than 2; pregnancy negative.  I have added hepatic function panel and D-dimer.  EKG personally reviewed and interpreted, no signs of ischemia.  Imaging: Chest x-ray pending.  Medications/Fluids: None ordered  Most recent vital signs reviewed and are as follows: BP 121/75 (BP Location: Right Arm)   Pulse 65   Temp 97.8 F (36.6 C) (Oral)   Resp 20   Wt 120.2 kg   LMP 04/11/2023   SpO2 100%   BMI 39.13 kg/m   Initial impression: Chest pain, likely chest wall pain.  Symptoms are not typical for the patient's usual gallbladder pain.  Given pleuritic nature, will rule out PE with D-dimer.  Patient low risk [place].  2:09 PM Reassessment performed. Patient appears stable, comfortable.  Labs personally reviewed and interpreted including: D-dimer normal; hepatic function panel normal.  Imaging personally visualized and interpreted including: Chest x-ray, agree negative.  Reviewed pertinent lab work and imaging with patient at bedside. Questions answered.   Most current vital signs reviewed and are as follows: BP 117/73   Pulse 60   Temp 97.8 F (36.6 C) (Oral)   Resp 13   Wt 120.2 kg   LMP 04/11/2023   SpO2 100%   BMI 39.13 kg/m   Plan: Discharge to home.   Prescriptions written for: Lidoderm , meloxicam   Other home care instructions discussed: Avoid heavy lifting, pushing, pulling,  repetitive motions with the upper body  Return and follow-up instructions: I encouraged patient to return to ED with severe chest pain, especially if the pain is crushing or pressure-like and spreads to the arms, back, neck, or jaw, or if they have associated sweating, vomiting, or shortness of breath with the pain, or significant pain with activity. We discussed that the evaluation here today indicates a low-risk of serious cause of chest pain, including heart trouble or a blood clot, but no evaluation is perfect and chest pain can evolve with time. The patient verbalized  understanding and agreed.                                 Medical Decision Making Amount and/or Complexity of Data Reviewed Labs: ordered. Radiology: ordered.  Risk Prescription drug management.   For this patient's complaint of chest pain, the following emergent conditions were considered on the differential diagnosis: acute coronary syndrome, pulmonary embolism, pneumothorax, myocarditis, pericardial tamponade, aortic dissection, thoracic aortic aneurysm complication, esophageal perforation.   Other causes were also considered including: gastroesophageal reflux disease, musculoskeletal pain including costochondritis, pneumonia/pleurisy, herpes zoster, pericarditis.  In regards to possibility of ACS, patient has atypical features of pain, non-ischemic and unchanged EKG and negative troponin(s). Heart score was calculated to be 1.   In regards to possibility of PE, symptoms are atypical for PE and risk profile is low, D-dimer negative.  Reproducibility of the patient's pain with palpation and reassuring workup, suggest chest wall pain.  Symptoms not typical for her gallbladder symptoms.  Do not feel that she requires emergent surgical evaluation.  The patient's vital signs, pertinent lab work and imaging were reviewed and interpreted as discussed in the ED course. Hospitalization was considered for further testing,  treatments, or serial exams/observation. However as patient is well-appearing, has a stable exam, and reassuring studies today, I do not feel that they warrant admission at this time. This plan was discussed with the patient who verbalizes agreement and comfort with this plan and seems reliable and able to return to the Emergency Department with worsening or changing symptoms.           Final Clinical Impression(s) / ED Diagnoses Final diagnoses:  Chest wall pain    Rx / DC Orders ED Discharge Orders          Ordered    lidocaine  (LIDODERM ) 5 %  Every 24 hours        05/10/23 1407    meloxicam  (MOBIC ) 7.5 MG tablet  Daily        05/10/23 1407              Desiderio Chew, PA-C 05/10/23 1411    Mannie Pac T, DO 05/11/23 [phone]

## 2023-05-10 NOTE — ED Notes (Signed)
Reviewed discharge instructions, medications, and home care with pt. Pt verbalized understanding and had no further questions. Pt exited ED without complications.

## 2023-05-10 NOTE — Discharge Instructions (Signed)
 Please read and follow all provided instructions.  Your diagnoses today include:  1. Chest wall pain     Tests performed today include: An EKG of your heart A chest x-ray Cardiac enzymes - a blood test for heart muscle damage Blood counts and electrolytes Liver function testing was negative D-dimer -screening test for blood clot was negative Vital signs. See below for your results today.   Medications prescribed:  Meloxicam  - anti-inflammatory pain medication  You have been prescribed an anti-inflammatory medication or NSAID. Take with food. Do not take aspirin, ibuprofen , or naproxen  if taking this medication. Take smallest effective dose for the shortest duration needed for your pain. Stop taking if you experience stomach pain or vomiting.   Take any prescribed medications only as directed.  Follow-up instructions: Please follow-up with your primary care provider as soon as you can for further evaluation of your symptoms.   Return instructions:  SEEK IMMEDIATE MEDICAL ATTENTION IF: You have severe chest pain, especially if the pain is crushing or pressure-like and spreads to the arms, back, neck, or jaw, or if you have sweating, nausea or vomiting, or trouble with breathing. THIS IS AN EMERGENCY. Do not wait to see if the pain will go away. Get medical help at once. Call 911. DO NOT drive yourself to the hospital.  Your chest pain gets worse and does not go away after a few minutes of rest.  You have an attack of chest pain lasting longer than what you usually experience.  You have significant dizziness, if you pass out, or have trouble walking.  You have chest pain not typical of your usual pain for which you originally saw your caregiver.  You have any other emergent concerns regarding your health.  Additional Information: Chest pain comes from many different causes. Your caregiver has diagnosed you as having chest pain that is not specific for one problem, but does not require  admission.  You are at low risk for an acute heart condition or other serious illness.   Your vital signs today were: BP 117/73   Pulse 60   Temp 97.8 F (36.6 C) (Oral)   Resp 13   Wt 120.2 kg   LMP 04/11/2023   SpO2 100%   BMI 39.13 kg/m  If your blood pressure (BP) was elevated above 135/85 this visit, please have this repeated by your doctor within one month. --------------

## 2023-05-14 NOTE — Progress Notes (Addendum)
 Surgical Instructions   Your procedure is scheduled on May 22, 2023. Report to [facility] Main Entrance A at 12:00 P.M., then check in with the Admitting office. Any questions or running late day of surgery: call [phone]  Questions prior to your surgery date: call [phone], Monday-Friday, 8am-4pm. If you experience any cold or flu symptoms such as cough, fever, chills, shortness of breath, etc. between now and your scheduled surgery, please notify us  at the above number.     Remember:  Do not eat after midnight the night before your surgery   You may drink clear liquids until 11:00 AM the morning of your surgery.   Clear liquids allowed are: Water, Non-Citrus Juices (without pulp), Carbonated Beverages, Clear Tea (no milk, honey, etc.), Black Coffee Only (NO MILK, CREAM OR POWDERED CREAMER of any kind), and Gatorade.    Take these medicines the morning of surgery with A SIP OF WATER: NONE   One week prior to surgery, STOP taking any Aspirin (unless otherwise instructed by your surgeon) Aleve , Naproxen , Ibuprofen , Motrin , Advil , Goody's, BC's, all herbal medications, fish oil, and non-prescription vitamins. This includes your medication: meloxicam  (MOBIC )                      Do NOT Smoke (Tobacco/Vaping) for 24 hours prior to your procedure.  If you use a CPAP at night, you may bring your mask/headgear for your overnight stay.   You will be asked to remove any contacts, glasses, piercing's, hearing aid's, dentures/partials prior to surgery. Please bring cases for these items if needed.    Patients discharged the day of surgery will not be allowed to drive home, and someone needs to stay with them for 24 hours.  SURGICAL WAITING ROOM VISITATION Patients may have no more than 2 support people in the waiting area - these visitors may rotate.   Pre-op nurse will coordinate an appropriate time for 1 ADULT support person, who may not rotate, to accompany patient in  pre-op.  Children under the age of 55 must have an adult with them who is not the patient and must remain in the main waiting area with an adult.  If the patient needs to stay at the hospital during part of their recovery, the visitor guidelines for inpatient rooms apply.  Please refer to the [facility] website for the visitor guidelines for any additional information.   If you received a COVID test during your pre-op visit  it is requested that you wear a mask when out in public, stay away from anyone that may not be feeling well and notify your surgeon if you develop symptoms. If you have been in contact with anyone that has tested positive in the last 10 days please notify you surgeon.      Pre-operative CHG Bathing Instructions   You can play a key role in reducing the risk of infection after surgery. Your skin needs to be as free of germs as possible. You can reduce the number of germs on your skin by washing with CHG (chlorhexidine  gluconate) soap before surgery. CHG is an antiseptic soap that kills germs and continues to kill germs even after washing.   DO NOT use if you have an allergy to chlorhexidine /CHG or antibacterial soaps. If your skin becomes reddened or irritated, stop using the CHG and notify one of our RNs at [phone].              TAKE A SHOWER THE NIGHT  BEFORE SURGERY AND THE DAY OF SURGERY    Please keep in mind the following:  DO NOT shave, including legs and underarms, 48 hours prior to surgery.   You may shave your face before/day of surgery.  Place clean sheets on your bed the night before surgery Use a clean washcloth (not used since being washed) for each shower. DO NOT sleep with pet's night before surgery.  CHG Shower Instructions:  Wash your face and private area with normal soap. If you choose to wash your hair, wash first with your normal shampoo.  After you use shampoo/soap, rinse your hair and body thoroughly to remove shampoo/soap residue.  Turn  the water OFF and apply half the bottle of CHG soap to a CLEAN washcloth.  Apply CHG soap ONLY FROM YOUR NECK DOWN TO YOUR TOES (washing for 3-5 minutes)  DO NOT use CHG soap on face, private areas, open wounds, or sores.  Pay special attention to the area where your surgery is being performed.  If you are having back surgery, having someone wash your back for you may be helpful. Wait 2 minutes after CHG soap is applied, then you may rinse off the CHG soap.  Pat dry with a clean towel  Put on clean pajamas    Additional instructions for the day of surgery: DO NOT APPLY any lotions, deodorants, cologne, or perfumes.   Do not wear jewelry or makeup Do not wear nail polish, gel polish, artificial nails, or any other type of covering on natural nails (fingers and toes) Do not bring valuables to the hospital. [facility] is not responsible for valuables/personal belongings. Put on clean/comfortable clothes.  Please brush your teeth.  Ask your nurse before applying any prescription medications to the skin.

## 2023-05-15 ENCOUNTER — Other Ambulatory Visit: Payer: Self-pay

## 2023-05-15 ENCOUNTER — Encounter (HOSPITAL_COMMUNITY): Payer: Self-pay

## 2023-05-15 ENCOUNTER — Encounter (HOSPITAL_COMMUNITY)
Admission: RE | Admit: 2023-05-15 | Discharge: 2023-05-15 | Disposition: A | Discharge status: Home / Self Care | Payer: Medicaid Other | Source: Ambulatory Visit | Attending: Surgery | Admitting: Surgery

## 2023-05-15 VITALS — BP 129/72 | HR 67 | Temp 98.1°F | Resp 18 | Ht 69.0 in | Wt 275.5 lb

## 2023-05-15 DIAGNOSIS — Z01818 Encounter for other preprocedural examination: Secondary | ICD-10-CM

## 2023-05-15 DIAGNOSIS — K219 Gastro-esophageal reflux disease without esophagitis: Secondary | ICD-10-CM | POA: Insufficient documentation

## 2023-05-15 DIAGNOSIS — J45909 Unspecified asthma, uncomplicated: Secondary | ICD-10-CM | POA: Diagnosis not present

## 2023-05-15 DIAGNOSIS — K828 Other specified diseases of gallbladder: Secondary | ICD-10-CM | POA: Insufficient documentation

## 2023-05-15 DIAGNOSIS — Z01812 Encounter for preprocedural laboratory examination: Secondary | ICD-10-CM | POA: Insufficient documentation

## 2023-05-15 HISTORY — DX: Anemia, unspecified: D64.9

## 2023-05-15 HISTORY — DX: Gastro-esophageal reflux disease without esophagitis: K21.9

## 2023-05-15 HISTORY — DX: Personal history of other diseases of the digestive system: Z87.19

## 2023-05-15 HISTORY — DX: Anxiety disorder, unspecified: F41.9

## 2023-05-15 NOTE — Anesthesia Preprocedure Evaluation (Addendum)
 Anesthesia Evaluation  Patient identified by MRN, date of birth, ID band Patient awake    Reviewed: Allergy & Precautions, NPO status , Patient's Chart, lab work & pertinent test results  History of Anesthesia Complications Negative for: history of anesthetic complications  Airway Mallampati: II  TM Distance: >3 FB Neck ROM: Full    Dental  (+) Teeth Intact, Dental Advisory Given   Pulmonary neg shortness of breath, asthma , neg sleep apnea, neg recent URI, Patient abstained from smoking.   breath sounds clear to auscultation       Cardiovascular hypertension,  Rhythm:Regular     Neuro/Psych   Anxiety     negative neurological ROS     GI/Hepatic Neg liver ROS, hiatal hernia,GERD  ,, Biliary Dyskinesia   Endo/Other  negative endocrine ROS    Renal/GU negative Renal ROS     Musculoskeletal negative musculoskeletal ROS (+)    Abdominal   Peds  Hematology  (+) Blood dyscrasia, anemia Lab Results      Component                Value               Date                      WBC                      5.3                 05/10/2023                HGB                      11.1 (L)            05/10/2023                HCT                      34.9 (L)            05/10/2023                MCV                      72.3 (L)            05/10/2023                PLT                      226                 05/10/2023              Anesthesia Other Findings   Reproductive/Obstetrics                             Anesthesia Physical Anesthesia Plan  ASA: 2  Anesthesia Plan: General   Post-op Pain Management:    Induction: Intravenous  PONV Risk Score and Plan: 4 or greater and Ondansetron  and Dexamethasone   Airway Management Planned: Oral ETT  Additional Equipment: None  Intra-op Plan:   Post-operative Plan: Extubation in OR  Informed Consent: I have reviewed the patients History and Physical,  chart, labs and discussed the procedure including the risks, benefits and  alternatives for the proposed anesthesia with the patient or authorized representative who has indicated his/her understanding and acceptance.     Dental advisory given  Plan Discussed with: CRNA  Anesthesia Plan Comments: (PAT note written 05/15/2023 by Allison Zelenak, PA-C.  )       Anesthesia Quick Evaluation

## 2023-05-15 NOTE — Progress Notes (Signed)
 Anesthesia Chart Review:  Case: [id number] Date/Time: 05/22/23 1345   Procedure: LAPAROSCOPIC CHOLECYSTECTOMY   Anesthesia type: General   Pre-op diagnosis: Biliary Dyskinesia   Location: [facility] ROOM 02 / [facility] OR   Surgeons: Paola Dreama SAILOR, MD       DISCUSSION: Patient is a 28 year old female scheduled for the above procedure.  History includes never smoker, HTN (as teenager), GERD, hiatal hernia, iron  deficiency anemia, post-COVID asthma, anxiety, [facility] Spotted Fever (09/2021). BMI is consistent with morbid obesity.  [place] ED visit 05/10/23 for upper and mid chest pain to left sided chest pain for the past week. Pain initially dull then sharp. Felt it was different from her gallbladder pains. Denied SOB, cough, hemoptysis. No prior DVT or PE history. No fevers. Intermittent N/V but not new as it had been associated with her gallbladder disease--had been scheduled for cholecystectomy, but rescheduled to 05/22/23 due to a new job. She wanted to be checked out prior to surgery. BP then 121/75, HR 65. EKG NSR. O2 sat 100%. Patient with tenderness over her sternum. D-dimer normal 0.47 (< 0.5), , Troponin HS < 2 x2, urine pregnancy test negative. HFP and BMP normal, WBC 5.3, H/H 11.1/34.9, PLT 226.  CXR showed no acute process. Based on ED work-up, provider wrote, Return and follow-up instructions: I encouraged patient to return to ED with severe chest pain, especially if the pain is crushing or pressure-like and spreads to the arms, back, neck, or jaw, or if they have associated sweating, vomiting, or shortness of breath with the pain, or significant pain with activity. We discussed that the evaluation here today indicates a low-risk of serious cause of chest pain, including heart trouble or a blood clot, but no evaluation is perfect and chest pain can evolve with time. The patient verbalized understanding and agreed.   At 03/14/24, patient reported chest pain is still present, less sharp now  and feels dull again. She still has chest wall tenderness to palpation and notices it more with deep breathing, but otherwise does not notice worsening with other movement. She does not associate symptoms with exertion. Pain had been present with turning her head from side to side, but that has resolved. No known recent illness or injury, but does keep active with her young daughter which involves lifting her up at times. On exam, Heart RRR, no murmur noted. She has chest wall tenderness. Lungs clear. No ankle edema. No carotid bruits. She is able to clean her house and do her own shopping. She continues to have some intermittent N/V but associated with greasy foods (taste or smell), which she is trying to avoid during surgery. Overall, she feels her symptoms are stable from 05/10/23 ED visit. No sick symptoms, SOB, syncope. Her recent ED work-up was reassuring as discussed above. With chest wall tenderness, symptoms seem more associated with musculoskeletal etiology, so I did not think additional testing indicated in PAT given work-up on 05/15/23; However, I did reiterate ED return precautions. She also feels her symptoms are stable and felt she had a thorough evaluation in the ED, so she did not desire further evaluation currently. She primarily just wanted anesthesia team to be aware of her symptoms and recent evaluation.  If any changes/worsening, she will seek further evaluation, otherwise anesthesia team to evaluate on the day of surgery. Discussed with anesthesiologist Darlyn Rush, MD.    VS: BP 129/72   Pulse 67   Temp 36.7 C   Resp 18  Ht 5' 9 (1.753 m)   Wt 125 kg   LMP 04/11/2023   SpO2 100%   BMI 40.68 kg/m   PROVIDERS: Elizbeth Leita Ruth, FNP is current PCP [facility]). She is trying to re-establish elsewhere.    LABS: Last lab results in [facility] include: Lab Results  Component Value Date   WBC 5.3 05/10/2023   HGB 11.1 (L) 05/10/2023   HCT 34.9 (L) 05/10/2023    PLT 226 05/10/2023   GLUCOSE 96 05/10/2023   ALT 18 05/10/2023   AST 15 05/10/2023   NA 137 05/10/2023   K 3.7 05/10/2023   CL 106 05/10/2023   CREATININE 0.91 05/10/2023   BUN 13 05/10/2023   CO2 25 05/10/2023    IMAGES: CXR 05/10/23: FINDINGS: Cardiac silhouette and mediastinal contours are within limits. The lungs are clear. No pleural effusion or pneumothorax. Normal regional bones. IMPRESSION: No active cardiopulmonary disease.  HIDA Scan 10/31/22: IMPRESSION: Abnormally decreased gallbladder ejection fraction most consistent with biliary dyskinesia.   EKG: 05/10/23: Normal sinus rhythm with sinus arrhythmia Normal ECG When compared with ECG of 26-Aug-2013 15:17, PREVIOUS ECG IS PRESENT Confirmed by Ruthe Cornet [phone]) on 05/11/2023 10:45:37 AM   CV: N/A  Past Medical History:  Diagnosis Date   Anemia    Iron  Deficiency   Anxiety    Asthma 2020   From COVID   GERD (gastroesophageal reflux disease)    History of hiatal hernia    Hypertension    Hx as a teenager. No issue as an adult   [facility] spotted fever 09/2021   From a tick bite. Completed treatment    Past Surgical History:  Procedure Laterality Date   WISDOM TOOTH EXTRACTION      MEDICATIONS:  lidocaine  (LIDODERM ) 5 %   meloxicam  (MOBIC ) 7.5 MG tablet   No current facility-administered medications for this encounter.    Isaiah Ruder, PA-C Surgical Short Stay/Anesthesiology [facility] Phone [phone] [facility] Phone [phone] 05/15/2023 11:25 AM

## 2023-05-15 NOTE — Progress Notes (Signed)
 PCP - Leita Jama Norse with [facility] - Pt is looking into new PCP Cardiologist - Denies  PPM/ICD - Denies Device Orders - n/a Rep Notified - n/a  Chest x-ray - 05/10/2023 EKG - 05/10/2023 Stress Test - Denies ECHO - Denies Cardiac Cath - Denies  Sleep Study - Denies CPAP - n/a  No DM  Last dose of GLP1 agonist- n/a GLP1 instructions: n/a  Blood Thinner Instructions: n/a Aspirin Instructions: n/a  ERAS Protcol - Clear liquids until 1100 morning of surgery PRE-SURGERY Ensure or G2- n/a  COVID TEST- n/a   Anesthesia review: Yes. Pt assess by Allison Zelenak, PA-C for ongoing chest pain/discomfort.   Patient denies shortness of breath, fever, cough and chest pain at PAT appointment. Pt denies any respiratory illness/infection in the last two months.   All instructions explained to the patient, with a verbal understanding of the material. Patient agrees to go over the instructions while at home for a better understanding. Patient also instructed to self quarantine after being tested for COVID-19. The opportunity to ask questions was provided.

## 2023-05-21 ENCOUNTER — Other Ambulatory Visit (HOSPITAL_BASED_OUTPATIENT_CLINIC_OR_DEPARTMENT_OTHER): Payer: Self-pay

## 2023-05-21 NOTE — Progress Notes (Signed)
 Patient voiced understanding of new arrival time of 1115, Clear liquids okay until 1015

## 2023-05-22 ENCOUNTER — Ambulatory Visit (HOSPITAL_BASED_OUTPATIENT_CLINIC_OR_DEPARTMENT_OTHER): Payer: Medicaid Other | Admitting: Anesthesiology

## 2023-05-22 ENCOUNTER — Other Ambulatory Visit: Payer: Self-pay

## 2023-05-22 ENCOUNTER — Encounter (HOSPITAL_COMMUNITY)
Admission: RE | Disposition: A | Discharge status: Home / Self Care | Payer: Self-pay | Source: Ambulatory Visit | Attending: Surgery

## 2023-05-22 ENCOUNTER — Ambulatory Visit (HOSPITAL_COMMUNITY): Payer: Medicaid Other | Admitting: Vascular Surgery

## 2023-05-22 ENCOUNTER — Ambulatory Visit (HOSPITAL_COMMUNITY)
Admission: RE | Admit: 2023-05-22 | Discharge: 2023-05-22 | Disposition: A | Discharge status: Home / Self Care | Payer: Medicaid Other | Source: Ambulatory Visit | Attending: Surgery | Admitting: Surgery

## 2023-05-22 ENCOUNTER — Encounter (HOSPITAL_COMMUNITY): Payer: Self-pay | Admitting: Surgery

## 2023-05-22 DIAGNOSIS — F1290 Cannabis use, unspecified, uncomplicated: Secondary | ICD-10-CM | POA: Insufficient documentation

## 2023-05-22 DIAGNOSIS — K828 Other specified diseases of gallbladder: Secondary | ICD-10-CM | POA: Diagnosis present

## 2023-05-22 DIAGNOSIS — K219 Gastro-esophageal reflux disease without esophagitis: Secondary | ICD-10-CM | POA: Diagnosis not present

## 2023-05-22 DIAGNOSIS — J45909 Unspecified asthma, uncomplicated: Secondary | ICD-10-CM | POA: Insufficient documentation

## 2023-05-22 DIAGNOSIS — I10 Essential (primary) hypertension: Secondary | ICD-10-CM | POA: Insufficient documentation

## 2023-05-22 DIAGNOSIS — Z01818 Encounter for other preprocedural examination: Secondary | ICD-10-CM

## 2023-05-22 HISTORY — PX: CHOLECYSTECTOMY: SHX55

## 2023-05-22 LAB — POCT PREGNANCY, URINE: Preg Test, Ur: NEGATIVE

## 2023-05-22 SURGERY — LAPAROSCOPIC CHOLECYSTECTOMY
Anesthesia: General | Site: Abdomen

## 2023-05-22 MED ORDER — METHOCARBAMOL 750 MG PO TABS: 750.0000 mg | ORAL_TABLET | Freq: Four times a day (QID) | ORAL | 1 refills | Status: AC

## 2023-05-22 MED ORDER — IBUPROFEN 600 MG PO TABS: 600.0000 mg | ORAL_TABLET | Freq: Four times a day (QID) | ORAL | 1 refills | Status: AC | PRN

## 2023-05-22 MED ORDER — FENTANYL CITRATE (PF) 100 MCG/2ML IJ SOLN
INTRAMUSCULAR | Status: AC
  Filled 2023-05-22: qty 2

## 2023-05-22 MED ORDER — ONDANSETRON HCL 4 MG/2ML IJ SOLN
INTRAMUSCULAR | Status: AC
  Filled 2023-05-22: qty 2

## 2023-05-22 MED ORDER — CHLORHEXIDINE GLUCONATE CLOTH 2 % EX PADS: 6.0000 | MEDICATED_PAD | Freq: Once | CUTANEOUS | Status: DC

## 2023-05-22 MED ORDER — SUGAMMADEX SODIUM 200 MG/2ML IV SOLN
INTRAVENOUS | Status: DC | PRN
  Administered 2023-05-22: 200 mg via INTRAVENOUS

## 2023-05-22 MED ORDER — PROPOFOL 10 MG/ML IV BOLUS
INTRAVENOUS | Status: AC
  Filled 2023-05-22: qty 20

## 2023-05-22 MED ORDER — DEXAMETHASONE SODIUM PHOSPHATE 10 MG/ML IJ SOLN
INTRAMUSCULAR | Status: AC
  Filled 2023-05-22: qty 1

## 2023-05-22 MED ORDER — ROCURONIUM BROMIDE 10 MG/ML (PF) SYRINGE
PREFILLED_SYRINGE | INTRAVENOUS | Status: DC | PRN
  Administered 2023-05-22: 70 mg via INTRAVENOUS

## 2023-05-22 MED ORDER — LACTATED RINGERS IV SOLN: INTRAVENOUS | Status: DC

## 2023-05-22 MED ORDER — BUPIVACAINE HCL (PF) 0.25 % IJ SOLN
INTRAMUSCULAR | Status: AC
  Filled 2023-05-22: qty 30

## 2023-05-22 MED ORDER — ACETAMINOPHEN 500 MG PO TABS: 1000.0000 mg | ORAL_TABLET | Freq: Once | ORAL | Status: DC | PRN

## 2023-05-22 MED ORDER — SUCCINYLCHOLINE 20MG/ML (10ML) SYRINGE FOR MEDFUSION PUMP - OPTIME: INTRAMUSCULAR | Status: DC | PRN

## 2023-05-22 MED ORDER — OXYCODONE HCL 5 MG PO TABS: 5.0000 mg | ORAL_TABLET | ORAL | 0 refills | Status: AC | PRN

## 2023-05-22 MED ORDER — DEXAMETHASONE SODIUM PHOSPHATE 10 MG/ML IJ SOLN
INTRAMUSCULAR | Status: DC | PRN
  Administered 2023-05-22: 5 mg via INTRAVENOUS

## 2023-05-22 MED ORDER — FENTANYL CITRATE (PF) 100 MCG/2ML IJ SOLN
25.0000 ug | INTRAMUSCULAR | Status: DC | PRN
Start: 2023-05-22 — End: 2023-05-22
  Administered 2023-05-22: 50 ug via INTRAVENOUS

## 2023-05-22 MED ORDER — OXYCODONE HCL 5 MG PO TABS
ORAL_TABLET | ORAL | Status: AC
  Filled 2023-05-22: qty 1

## 2023-05-22 MED ORDER — MIDAZOLAM HCL 2 MG/2ML IJ SOLN
INTRAMUSCULAR | Status: AC
  Filled 2023-05-22: qty 2

## 2023-05-22 MED ORDER — CHLORHEXIDINE GLUCONATE 0.12 % MT SOLN
15.0000 mL | Freq: Once | OROMUCOSAL | Status: AC
  Administered 2023-05-22: 15 mL via OROMUCOSAL
  Filled 2023-05-22: qty 15

## 2023-05-22 MED ORDER — ONDANSETRON HCL 4 MG/2ML IJ SOLN
INTRAMUSCULAR | Status: DC | PRN
  Administered 2023-05-22: 4 mg via INTRAVENOUS

## 2023-05-22 MED ORDER — 0.9 % SODIUM CHLORIDE (POUR BTL) OPTIME
TOPICAL | Status: DC | PRN
  Administered 2023-05-22: 1000 mL

## 2023-05-22 MED ORDER — PROPOFOL 10 MG/ML IV BOLUS
INTRAVENOUS | Status: DC | PRN
  Administered 2023-05-22: 200 mg via INTRAVENOUS

## 2023-05-22 MED ORDER — OXYCODONE HCL 5 MG PO TABS
5.0000 mg | ORAL_TABLET | Freq: Once | ORAL | Status: AC | PRN
  Administered 2023-05-22: 5 mg via ORAL

## 2023-05-22 MED ORDER — ACETAMINOPHEN 10 MG/ML IV SOLN: 1000.0000 mg | Freq: Once | INTRAVENOUS | Status: DC | PRN

## 2023-05-22 MED ORDER — FENTANYL CITRATE (PF) 250 MCG/5ML IJ SOLN
INTRAMUSCULAR | Status: AC
  Filled 2023-05-22: qty 5

## 2023-05-22 MED ORDER — ORAL CARE MOUTH RINSE: 15.0000 mL | Freq: Once | OROMUCOSAL | Status: AC

## 2023-05-22 MED ORDER — MIDAZOLAM HCL 2 MG/2ML IJ SOLN
INTRAMUSCULAR | Status: DC | PRN
  Administered 2023-05-22: 2 mg via INTRAVENOUS

## 2023-05-22 MED ORDER — ACETAMINOPHEN 500 MG PO TABS: 1000.0000 mg | ORAL_TABLET | Freq: Four times a day (QID) | ORAL | 3 refills | Status: AC

## 2023-05-22 MED ORDER — ACETAMINOPHEN 500 MG PO TABS
1000.0000 mg | ORAL_TABLET | ORAL | Status: AC
  Administered 2023-05-22: 1000 mg via ORAL
  Filled 2023-05-22: qty 2

## 2023-05-22 MED ORDER — SODIUM CHLORIDE 0.9 % IV SOLN
3.0000 g | INTRAVENOUS | Status: AC
  Administered 2023-05-22: 3 g via INTRAVENOUS
  Filled 2023-05-22: qty 3

## 2023-05-22 MED ORDER — FENTANYL CITRATE (PF) 100 MCG/2ML IJ SOLN: 25.0000 ug | INTRAMUSCULAR | Status: DC | PRN

## 2023-05-22 MED ORDER — BUPIVACAINE LIPOSOME 1.3 % IJ SUSP: 20.0000 mL | Freq: Once | INTRAMUSCULAR | Status: DC

## 2023-05-22 MED ORDER — BUPIVACAINE LIPOSOME 1.3 % IJ SUSP
INTRAMUSCULAR | Status: AC
  Filled 2023-05-22: qty 20

## 2023-05-22 MED ORDER — BUPIVACAINE LIPOSOME 1.3 % IJ SUSP
INTRAMUSCULAR | Status: DC | PRN
  Administered 2023-05-22: 20 mL

## 2023-05-22 MED ORDER — FENTANYL CITRATE (PF) 250 MCG/5ML IJ SOLN
INTRAMUSCULAR | Status: DC | PRN
  Administered 2023-05-22: 5 ug via INTRAVENOUS
  Administered 2023-05-22: 200 ug via INTRAVENOUS

## 2023-05-22 MED ORDER — ACETAMINOPHEN 160 MG/5ML PO SOLN: 1000.0000 mg | Freq: Once | ORAL | Status: DC | PRN

## 2023-05-22 MED ORDER — DOCUSATE SODIUM 100 MG PO CAPS: 100.0000 mg | ORAL_CAPSULE | Freq: Two times a day (BID) | ORAL | 2 refills | Status: AC

## 2023-05-22 MED ORDER — OXYCODONE HCL 5 MG/5ML PO SOLN
5.0000 mg | Freq: Once | ORAL | Status: AC | PRN
Start: 2023-05-22 — End: 2023-05-22

## 2023-05-22 MED ORDER — SODIUM CHLORIDE 0.9 % IV SOLN: INTRAVENOUS | Status: DC | PRN

## 2023-05-22 MED ORDER — ENOXAPARIN SODIUM 40 MG/0.4ML IJ SOSY
40.0000 mg | PREFILLED_SYRINGE | Freq: Once | INTRAMUSCULAR | Status: AC
  Administered 2023-05-22: 40 mg via SUBCUTANEOUS
  Filled 2023-05-22: qty 0.4

## 2023-05-22 MED ORDER — BUPIVACAINE HCL 0.25 % IJ SOLN
INTRAMUSCULAR | Status: DC | PRN
  Administered 2023-05-22: 20 mL

## 2023-05-22 SURGICAL SUPPLY — 29 items
APPLIER CLIP 5 13 M/L LIGAMAX5 (MISCELLANEOUS) ×1 IMPLANT
CHLORAPREP W/TINT 26 (MISCELLANEOUS) ×1 IMPLANT
CLIP APPLIE 5 13 M/L LIGAMAX5 (MISCELLANEOUS) ×1 IMPLANT
COVER SURGICAL LIGHT HANDLE (MISCELLANEOUS) ×1 IMPLANT
DERMABOND ADVANCED .7 DNX12 (GAUZE/BANDAGES/DRESSINGS) ×1 IMPLANT
ELECT CAUTERY BLADE 6.4 (BLADE) ×1 IMPLANT
ELECT REM PT RETURN 9FT ADLT (ELECTROSURGICAL) ×1 IMPLANT
ELECTRODE REM PT RTRN 9FT ADLT (ELECTROSURGICAL) ×1 IMPLANT
GLOVE BIO SURGEON STRL SZ 6.5 (GLOVE) ×1 IMPLANT
GLOVE BIOGEL PI IND STRL 6 (GLOVE) ×1 IMPLANT
GOWN STRL REUS W/ TWL LRG LVL3 (GOWN DISPOSABLE) ×3 IMPLANT
KIT BASIN OR (CUSTOM PROCEDURE TRAY) ×1 IMPLANT
KIT TURNOVER KIT B (KITS) ×1 IMPLANT
NS IRRIG 1000ML POUR BTL (IV SOLUTION) ×1 IMPLANT
PAD ARMBOARD 7.5X6 YLW CONV (MISCELLANEOUS) ×1 IMPLANT
PENCIL BUTTON HOLSTER BLD 10FT (ELECTRODE) ×1 IMPLANT
POUCH RETRIEVAL ECOSAC 10 (ENDOMECHANICALS) ×1 IMPLANT
SCISSORS LAP 5X35 DISP (ENDOMECHANICALS) ×1 IMPLANT
SET TUBE SMOKE EVAC HIGH FLOW (TUBING) ×1 IMPLANT
SLEEVE Z-THREAD 5X100MM (TROCAR) ×2 IMPLANT
SUT MNCRL AB 4-0 PS2 18 (SUTURE) ×1 IMPLANT
SUT VIC AB 0 UR5 27 (SUTURE) IMPLANT
SUT VICRYL 0 AB UR-6 (SUTURE) IMPLANT
TOWEL GREEN STERILE FF (TOWEL DISPOSABLE) ×1 IMPLANT
TRAY LAPAROSCOPIC MC (CUSTOM PROCEDURE TRAY) ×1 IMPLANT
TROCAR BALLN 12MMX100 BLUNT (TROCAR) ×1 IMPLANT
TROCAR Z-THREAD OPTICAL 5X100M (TROCAR) ×1 IMPLANT
WARMER LAPAROSCOPE (MISCELLANEOUS) ×1 IMPLANT
WATER STERILE IRR 1000ML POUR (IV SOLUTION) ×1 IMPLANT

## 2023-05-22 NOTE — Transfer of Care (Signed)
 Immediate Anesthesia Transfer of Care Note  Patient: Brooke Horn  Procedure(s) Performed: LAPAROSCOPIC CHOLECYSTECTOMY  Patient Location: PACU  Anesthesia Type:General  Level of Consciousness: awake, alert , and drowsy  Airway & Oxygen Therapy: Patient Spontanous Breathing  Post-op Assessment: Report given to RN, Post -op Vital signs reviewed and stable, and Patient moving all extremities X 4  Post vital signs: Reviewed and stable  Last Vitals:  Vitals Value Taken Time  BP 116/44 05/22/23 1400  Temp    Pulse 60 05/22/23 1401  Resp 13 05/22/23 1401  SpO2 99 % 05/22/23 1401  Vitals shown include unfiled device data.  Last Pain:  Vitals:   05/22/23 1217  TempSrc:   PainSc: 0-No pain      Patients Stated Pain Goal: 0 (05/22/23 1217)  Complications: No notable events documented.

## 2023-05-22 NOTE — Op Note (Signed)
   Operative Note  Date: 05/22/2023  Procedure: laparoscopic cholecystectomy  Pre-op diagnosis: biliary colic Post-op diagnosis: same  Indication and clinical history: The patient is a 28 y.o. year old female with biliary colic.  Surgeon: Dreama GEANNIE Hanger, MD  Anesthesiologist: Leopoldo, MD Anesthesia: General  Findings:  Specimen: gallbladder EBL: <5cc Drains/Implants: none  Disposition: PACU - hemodynamically stable.  Description of procedure: The patient was positioned supine on the operating room table. Time-out was performed verifying correct patient, procedure, signature of informed consent, and administration of pre-operative antibiotics, VTE prophylaxis with low molecular weight heparin. General anesthetic induction and intubation were uneventful. The abdomen was prepped and draped in the usual sterile fashion. An infra-umbilical incision was made using an open technique using zero vicryl stay sutures on either side of the fascia and a 10mm Hassan port inserted. After establishing pneumoperitoneum, which the patient tolerated well, the abdominal cavity was inspected and no injury of any intra-abdominal structures was identified. Additional ports were placed under direct visualization and using local anesthetic: two 5mm ports in the right subcostal region and a 5mm port in the epigastric region. The patient was re-positioned to reverse Trendelenburg and right side up. Adhesiolysis was performed to expose the gallbladder, which was then retracted cephalad. The infundibulum was identified and retracted toward the right lower quadrant. The peritoneum was incised over the infundibulum and the triangle of Calot dissected to expose the critical view of safety. With clear identification and isolation of the cystic duct and cystic artery, the cystic artery was doubly clipped and divided. After this, the cystic duct was identified as a single structure entering the gallbladder, and was also doubly  clipped and divided. The gallbladder was dissected off the liver bed using electrocautery and hemostasis of the liver bed was confirmed prior to separation of the final peritoneal attachments of the gallbladder to the liver bed. After transection of the final peritoneal attachments, the gallbladder was placed in an endoscopic specimen retrieval bag, removed via the umbilical port site, and sent to pathology as a permanent specimen. The gallbladder fossa was inspected confirming hemostasis, the absence of bile leakage from the cystic duct stump, and correct placement of clips on the cystic artery and cystic duct stumps. The abdomen was desufflated and the fascia of the umbilical port site was closed using the previously placed stay sutures. Additional local anesthetic was administered at the umbilical port site.  The skin of all incisions was closed with 4-0 monocryl. Sterile dressings were applied. All sponge and instrument counts were correct at the conclusion of the procedure. The patient was awakened from anesthesia, extubated uneventfully, and transported to the PACU - hemodynamically stable. There were no complications.    Dreama GEANNIE Hanger, MD General and Trauma Surgery [facility]

## 2023-05-22 NOTE — H&P (Signed)
    Marlia Schewe is an 28 y.o. female.   HPI: 34F with symptomatic cholelithiasis. Plan for lap chole. The patient has had no hospitalizations, doctors visits, surgeries, or newly diagnosed allergies since being seen in the office. She was seen in the ED 1/2 for CP with unremarkable w/u. Has ben persistently symptomatic with biliary symptoms since being seen in the office.    Past Medical History:  Diagnosis Date   Anemia    Iron  Deficiency   Anxiety    Asthma 2020   From COVID   GERD (gastroesophageal reflux disease)    History of hiatal hernia    Hypertension    Hx as a teenager. No issue as an adult   [facility] spotted fever 09/2021   From a tick bite. Completed treatment    Past Surgical History:  Procedure Laterality Date   WISDOM TOOTH EXTRACTION      Family History  Problem Relation Age of Onset   Cancer Mother    Diabetes Maternal Grandmother    Hypertension Maternal Grandmother     Social History:  reports that she has never smoked. She has never used smokeless tobacco. She reports current alcohol use. She reports current drug use. Drug: Marijuana.  Allergies: No Known Allergies  Medications: I have reviewed the patient's current medications.  No results found for this or any previous visit (from the past 48 hours).  No results found.  ROS 10 point review of systems is negative except as listed above in HPI.   Physical Exam Last menstrual period 04/11/2023, unknown if currently breastfeeding. Constitutional: well-developed, well-nourished HEENT: pupils equal, round, reactive to light, 2mm b/l, moist conjunctiva, external inspection of ears and nose normal, hearing intact Oropharynx: normal oropharyngeal mucosa, normal dentition Neck: no thyromegaly, trachea midline, no midline cervical tenderness to palpation Chest: breath sounds equal bilaterally, normal respiratory effort, no midline or lateral chest wall tenderness to palpation/deformity Abdomen:  soft, NT, no bruising, no hepatosplenomegaly GU: normal female genitalia  Back: no wounds, no thoracic/lumbar spine tenderness to palpation, no thoracic/lumbar spine stepoffs Rectal: deferred Skin: warm, dry, no rashes Psych: normal memory, normal mood/affect     Assessment/Plan: Biliary dyskinesia - plan lap chole. Informed consent was obtained after detailed explanation of risks, including bleeding, infection, biloma, hematoma, injury to common bile duct, need for IOC to delineate anatomy, and need for conversion to open procedure. All questions answered to the patient's satisfaction. FEN - strict NPO DVT - SCDs, SQH Dispo -  home post-op     Dreama GEANNIE Hanger, MD General and Trauma Surgery [facility]

## 2023-05-22 NOTE — Anesthesia Procedure Notes (Signed)
 Procedure Name: Intubation Date/Time: 05/22/2023 1:03 PM  Performed by: Lansing Hildegard NOVAK, CRNAPre-anesthesia Checklist: Patient identified, Emergency Drugs available, Suction available and Patient being monitored Patient Re-evaluated:Patient Re-evaluated prior to induction Oxygen Delivery Method: Circle System Utilized Preoxygenation: Pre-oxygenation with 100% oxygen Induction Type: IV induction and Cricoid Pressure applied Ventilation: Mask ventilation without difficulty Laryngoscope Size: Mac and 4 Grade View: Grade I Tube type: Oral Tube size: 7.0 mm Number of attempts: 1 Airway Equipment and Method: Stylet Placement Confirmation: ETT inserted through vocal cords under direct vision, positive ETCO2 and breath sounds checked- equal and bilateral Secured at: 21 cm Tube secured with: Tape Dental Injury: Teeth and Oropharynx as per pre-operative assessment

## 2023-05-22 NOTE — Discharge Instructions (Addendum)
 [facility]  LAPAROSCOPIC SURGERY: POST OP INSTRUCTIONS Always review your discharge instruction sheet given to you by the facility where your surgery was performed. IF YOU HAVE DISABILITY OR FAMILY LEAVE FORMS, YOU MUST BRING THEM TO THE OFFICE FOR PROCESSING.   DO NOT GIVE THEM TO YOUR DOCTOR.  PAIN CONTROL  Pain regimen: take over-the-counter tylenol  (acetaminophen ) 1000mg  every six hours, the prescription ibuprofen  (600mg ) every six hours and the robaxin  (methocarbamol ) 750mg  every six hours. With all three of these, you should be taking something every two hours. Example: tylenol  (acetaminophen ) at 8am, ibuprofen  at 10am, robaxin  (methocarbamol ) at 12pm, tylenol  (acetaminophen ) again at 2pm, ibuprofen  again at 4pm, robaxin  (methocarbamol ) at 6pm. You also have a prescription for oxycodone , which should be taken if the tylenol  (acetaminophen ), ibuprofen , and robaxin  (methocarbamol ) are not enough to control your pain. You may take the oxycodone  as frequently as every four hours as needed, but if you are taking the other medications as above, you should not need the oxycodone  this frequently. You have also been given a prescription for colace (docusate) which is a stool softener. Please take this as prescribed because the oxycodone  can cause constipation and the colace (docusate) will minimize or prevent constipation. Do not drive while taking or under the influence of the oxycodone  as it is a narcotic medication. Use ice packs to help control pain. If you need a refill on your pain medication, please contact your pharmacy.  They will contact our office to request authorization. Prescriptions will not be filled after 5pm or on week-ends.  HOME MEDICATIONS Take your usually prescribed medications unless otherwise directed.  DIET You should follow a light diet the first few days after arrival home.  Be sure to include lots of fluids daily.  Do not consume alcohol while  taking oxycodone  or ibuprofen .   CONSTIPATION It is common to experience some constipation after surgery and if you are taking pain medication.  Increasing fluid intake and taking a stool softener (such as Colace) will usually help or prevent this problem from occurring.  A mild laxative (Miralax, over-the counter) should be taken according to package instructions if there are no bowel movements after 48 hours. If still no bowel movement 24 hours after taking Miralax, you may try magnesium citrate, available over the counter at a local pharmacy.   WOUND/INCISION CARE Most patients will experience some swelling and bruising in the area of the incisions.  Ice packs will help.  Swelling and bruising can take several days to resolve.  May shower beginning 05/23/2023.  Do not peel off or scrub skin glue. May allow warm soapy water to run over incision, then rinse and pat dry.  Do not soak in any water (tubs, hot tubs, pools, lakes, oceans) for one week.   ACTIVITIES You may resume regular (light) daily activities beginning the next day--such as daily self-care, walking, climbing stairs--gradually increasing activities as tolerated.  You may have sexual intercourse when it is comfortable.   No lifting greater than 5 pounds for six weeks.  You may drive when you are no longer taking narcotic pain medication, you can comfortably wear a seatbelt, and you can safely maneuver your car and apply brakes.  FOLLOW-UP You should see your doctor in the office for a follow-up appointment approximately 2-3 weeks after your surgery.  You should have been given your post-op/follow-up appointment when your surgery was scheduled.  If you did not receive a post-op/follow-up appointment, make sure that you  call for this appointment within a day or two after you arrive home to ensure a convenient appointment time.  WHEN TO CALL YOUR DOCTOR: Fever over 101.5 Inability to urinate Continued bleeding from  incision. Increased pain, redness, or drainage from the incision. Increasing abdominal pain  The clinic staff is available to answer your questions during regular business hours.  Please don't hesitate to call and ask to speak to one of the nurses for clinical concerns.  If you have a medical emergency, go to the nearest emergency room or call 911.  A surgeon from [facility] is always on call at the hospital. [address], [address], [place], [place]  [address] ? [address] [address], [place], [place]   [address] [phone] ? [phone] ? FAX [phone] Web site: [email or web address]

## 2023-05-23 ENCOUNTER — Encounter (HOSPITAL_COMMUNITY): Payer: Self-pay | Admitting: Surgery

## 2023-05-23 LAB — SURGICAL PATHOLOGY

## 2023-05-23 NOTE — Anesthesia Postprocedure Evaluation (Signed)
 Anesthesia Post Note  Patient: Brooke Horn  Procedure(s) Performed: LAPAROSCOPIC CHOLECYSTECTOMY (Abdomen)     Patient location during evaluation: PACU Anesthesia Type: General Level of consciousness: awake and alert Pain management: pain level controlled Vital Signs Assessment: post-procedure vital signs reviewed and stable Respiratory status: spontaneous breathing, nonlabored ventilation and respiratory function stable Cardiovascular status: blood pressure returned to baseline and stable Postop Assessment: no apparent nausea or vomiting Anesthetic complications: no   No notable events documented.  Last Vitals:  Vitals:   05/22/23 1445 05/22/23 1500  BP: 121/69 136/86  Pulse: (!) 52 63  Resp: 17 16  Temp:  36.6 C  SpO2: 100% 100%    Last Pain:  Vitals:   05/22/23 1500  TempSrc:   PainSc: 4                  Cruzito Standre
